# Patient Record
Sex: Male | Born: 1965 | Race: White | Hispanic: No | Marital: Married | Smoking: Never smoker
Health system: Southern US, Community
[De-identification: ages and names within clinical notes are randomized; demographics above are authoritative.]

## PROBLEM LIST (undated history)

## (undated) DIAGNOSIS — E7800 Pure hypercholesterolemia, unspecified: Secondary | ICD-10-CM

## (undated) DIAGNOSIS — T8579XA Infection and inflammatory reaction due to other internal prosthetic devices, implants and grafts, initial encounter: Secondary | ICD-10-CM

## (undated) DIAGNOSIS — I10 Essential (primary) hypertension: Secondary | ICD-10-CM

## (undated) DIAGNOSIS — T8149XA Infection following a procedure, other surgical site, initial encounter: Secondary | ICD-10-CM

## (undated) HISTORY — PX: BACK SURGERY: SHX140

## (undated) HISTORY — PX: REPLACEMENT TOTAL HIP W/  RESURFACING IMPLANTS: SUR1222

---

## 2009-07-20 ENCOUNTER — Emergency Department (HOSPITAL_BASED_OUTPATIENT_CLINIC_OR_DEPARTMENT_OTHER): Admission: EM | Admit: 2009-07-20 | Discharge: 2009-07-20 | Payer: Self-pay | Admitting: *Deleted

## 2009-10-14 ENCOUNTER — Ambulatory Visit (HOSPITAL_COMMUNITY): Admission: RE | Admit: 2009-10-14 | Discharge: 2009-10-14 | Payer: Self-pay | Admitting: *Deleted

## 2010-02-13 ENCOUNTER — Encounter: Admission: RE | Admit: 2010-02-13 | Discharge: 2010-02-13 | Payer: Self-pay | Admitting: *Deleted

## 2010-07-16 LAB — PROTIME-INR
INR: 1.02 (ref 0.00–1.49)
Prothrombin Time: 13.3 seconds (ref 11.6–15.2)

## 2010-07-16 LAB — DIFFERENTIAL
Lymphocytes Relative: 28 % (ref 12–46)
Lymphs Abs: 2.4 10*3/uL (ref 0.7–4.0)
Monocytes Relative: 10 % (ref 3–12)
Neutro Abs: 5.1 10*3/uL (ref 1.7–7.7)
Neutrophils Relative %: 60 % (ref 43–77)

## 2010-07-16 LAB — COMPREHENSIVE METABOLIC PANEL
ALT: 21 U/L (ref 0–53)
BUN: 9 mg/dL (ref 6–23)
Calcium: 9.7 mg/dL (ref 8.4–10.5)
Creatinine, Ser: 0.94 mg/dL (ref 0.4–1.5)
GFR calc non Af Amer: >60 mL/min (ref >60)
Glucose, Bld: 101 mg/dL — ABNORMAL HIGH (ref 70–99)
Sodium: 137 mEq/L (ref 135–145)
Total Protein: 7 g/dL (ref 6.0–8.3)

## 2010-07-16 LAB — URINE MICROSCOPIC-ADD ON

## 2010-07-16 LAB — CBC
Hemoglobin: 15 g/dL (ref 13.0–17.0)
MCHC: 34.8 g/dL (ref 30.0–36.0)
MCV: 86.5 fL (ref 78.0–100.0)
RDW: 12.6 % (ref 11.5–15.5)

## 2010-07-16 LAB — ABO/RH: ABO/RH(D): A POS

## 2010-07-16 LAB — APTT: aPTT: 28 seconds (ref 24–37)

## 2010-07-16 LAB — TYPE AND SCREEN: ABO/RH(D): A POS

## 2010-07-16 LAB — URINALYSIS, ROUTINE W REFLEX MICROSCOPIC
Nitrite: NEGATIVE
Protein, ur: NEGATIVE mg/dL
Specific Gravity, Urine: 1.016 (ref 1.005–1.030)
Urobilinogen, UA: 1 mg/dL (ref 0.0–1.0)

## 2013-04-30 HISTORY — PX: INGUINAL HERNIA REPAIR: SUR1180

## 2014-04-30 HISTORY — PX: HERNIA MESH REMOVAL: SHX1745

## 2014-04-30 HISTORY — PX: INGUINAL HERNIA REPAIR: SUR1180

## 2014-06-15 DIAGNOSIS — Z8249 Family history of ischemic heart disease and other diseases of the circulatory system: Secondary | ICD-10-CM | POA: Insufficient documentation

## 2014-08-24 DIAGNOSIS — N200 Calculus of kidney: Secondary | ICD-10-CM | POA: Insufficient documentation

## 2014-10-23 DIAGNOSIS — T8149XA Infection following a procedure, other surgical site, initial encounter: Secondary | ICD-10-CM

## 2014-10-23 HISTORY — DX: Infection following a procedure, other surgical site, initial encounter: T81.49XA

## 2017-03-01 ENCOUNTER — Encounter (HOSPITAL_BASED_OUTPATIENT_CLINIC_OR_DEPARTMENT_OTHER): Payer: Self-pay | Admitting: *Deleted

## 2017-03-01 ENCOUNTER — Inpatient Hospital Stay (HOSPITAL_BASED_OUTPATIENT_CLINIC_OR_DEPARTMENT_OTHER)
Admission: EM | Admit: 2017-03-01 | Discharge: 2017-03-05 | DRG: 351 | Disposition: A | Discharge status: Home / Self Care | Payer: Worker's Compensation | Attending: General Surgery | Admitting: General Surgery

## 2017-03-01 DIAGNOSIS — E7800 Pure hypercholesterolemia, unspecified: Secondary | ICD-10-CM | POA: Diagnosis present

## 2017-03-01 DIAGNOSIS — N5089 Other specified disorders of the male genital organs: Secondary | ICD-10-CM | POA: Diagnosis present

## 2017-03-01 DIAGNOSIS — D176 Benign lipomatous neoplasm of spermatic cord: Secondary | ICD-10-CM | POA: Diagnosis present

## 2017-03-01 DIAGNOSIS — Z881 Allergy status to other antibiotic agents status: Secondary | ICD-10-CM

## 2017-03-01 DIAGNOSIS — K4091 Unilateral inguinal hernia, without obstruction or gangrene, recurrent: Secondary | ICD-10-CM | POA: Diagnosis not present

## 2017-03-01 DIAGNOSIS — K4031 Unilateral inguinal hernia, with obstruction, without gangrene, recurrent: Principal | ICD-10-CM | POA: Diagnosis present

## 2017-03-01 DIAGNOSIS — K4090 Unilateral inguinal hernia, without obstruction or gangrene, not specified as recurrent: Secondary | ICD-10-CM

## 2017-03-01 DIAGNOSIS — K420 Umbilical hernia with obstruction, without gangrene: Secondary | ICD-10-CM

## 2017-03-01 DIAGNOSIS — Z22322 Carrier or suspected carrier of Methicillin resistant Staphylococcus aureus: Secondary | ICD-10-CM

## 2017-03-01 DIAGNOSIS — Z885 Allergy status to narcotic agent status: Secondary | ICD-10-CM

## 2017-03-01 DIAGNOSIS — E785 Hyperlipidemia, unspecified: Secondary | ICD-10-CM | POA: Diagnosis present

## 2017-03-01 DIAGNOSIS — I10 Essential (primary) hypertension: Secondary | ICD-10-CM | POA: Diagnosis present

## 2017-03-01 DIAGNOSIS — K4030 Unilateral inguinal hernia, with obstruction, without gangrene, not specified as recurrent: Secondary | ICD-10-CM | POA: Diagnosis present

## 2017-03-01 HISTORY — DX: Pure hypercholesterolemia, unspecified: E78.00

## 2017-03-01 HISTORY — DX: Essential (primary) hypertension: I10

## 2017-03-01 LAB — CBC WITH DIFFERENTIAL/PLATELET
BASOS ABS: 0 10*3/uL (ref 0.0–0.1)
BASOS PCT: 0 %
EOS ABS: 0.2 10*3/uL (ref 0.0–0.7)
EOS PCT: 2 %
HCT: 44.3 % (ref 39.0–52.0)
Hemoglobin: 15 g/dL (ref 13.0–17.0)
LYMPHS PCT: 23 %
Lymphs Abs: 1.9 10*3/uL (ref 0.7–4.0)
MCH: 29.9 pg (ref 26.0–34.0)
MCHC: 33.9 g/dL (ref 30.0–36.0)
MCV: 88.4 fL (ref 78.0–100.0)
MONO ABS: 0.9 10*3/uL (ref 0.1–1.0)
Monocytes Relative: 10 %
Neutro Abs: 5.3 10*3/uL (ref 1.7–7.7)
Neutrophils Relative %: 65 %
PLATELETS: 255 10*3/uL (ref 150–400)
RBC: 5.01 MIL/uL (ref 4.22–5.81)
RDW: 13 % (ref 11.5–15.5)
WBC: 8.3 10*3/uL (ref 4.0–10.5)

## 2017-03-01 LAB — BASIC METABOLIC PANEL
ANION GAP: 8 (ref 5–15)
BUN: 16 mg/dL (ref 6–20)
CALCIUM: 9.5 mg/dL (ref 8.9–10.3)
CO2: 24 mmol/L (ref 22–32)
Chloride: 106 mmol/L (ref 101–111)
Creatinine, Ser: 0.98 mg/dL (ref 0.61–1.24)
Glucose, Bld: 110 mg/dL — ABNORMAL HIGH (ref 65–99)
POTASSIUM: 4 mmol/L (ref 3.5–5.1)
SODIUM: 138 mmol/L (ref 135–145)

## 2017-03-01 MED ORDER — HYDROMORPHONE HCL 1 MG/ML IJ SOLN
0.5000 mg | Freq: Once | INTRAMUSCULAR | Status: AC
  Administered 2017-03-01: 0.5 mg via INTRAVENOUS

## 2017-03-01 MED ORDER — DIAZEPAM 5 MG/ML IJ SOLN
2.0000 mg | Freq: Once | INTRAMUSCULAR | Status: AC
  Administered 2017-03-01: 2 mg via INTRAVENOUS
  Filled 2017-03-01: qty 2

## 2017-03-01 MED ORDER — HYDROMORPHONE HCL 1 MG/ML IJ SOLN
0.5000 mg | Freq: Once | INTRAMUSCULAR | Status: AC
  Administered 2017-03-01: 0.5 mg via INTRAVENOUS
  Filled 2017-03-01: qty 1

## 2017-03-01 MED ORDER — KCL IN DEXTROSE-NACL 20-5-0.9 MEQ/L-%-% IV SOLN
INTRAVENOUS | Status: DC
  Administered 2017-03-01: 30 mL/h via INTRAVENOUS
  Administered 2017-03-03: via INTRAVENOUS
  Filled 2017-03-01 (×3): qty 1000

## 2017-03-01 MED ORDER — METHOCARBAMOL 1000 MG/10ML IJ SOLN
500.0000 mg | Freq: Four times a day (QID) | INTRAVENOUS | Status: DC
  Administered 2017-03-01 – 2017-03-05 (×13): 500 mg via INTRAVENOUS
  Filled 2017-03-01 (×8): qty 550
  Filled 2017-03-01: qty 5
  Filled 2017-03-01 (×4): qty 550
  Filled 2017-03-01: qty 5
  Filled 2017-03-01: qty 550

## 2017-03-01 MED ORDER — ONDANSETRON 4 MG PO TBDP: 4.0000 mg | ORAL_TABLET | Freq: Four times a day (QID) | ORAL | Status: DC | PRN

## 2017-03-01 MED ORDER — HYDROMORPHONE HCL 1 MG/ML IJ SOLN
1.0000 mg | Freq: Once | INTRAMUSCULAR | Status: DC
  Filled 2017-03-01: qty 1

## 2017-03-01 MED ORDER — ONDANSETRON HCL 4 MG/2ML IJ SOLN: 4.0000 mg | Freq: Four times a day (QID) | INTRAMUSCULAR | Status: DC | PRN

## 2017-03-01 MED ORDER — SODIUM CHLORIDE 0.9 % IV BOLUS (SEPSIS)
500.0000 mL | Freq: Once | INTRAVENOUS | Status: AC
  Administered 2017-03-01: 500 mL via INTRAVENOUS

## 2017-03-01 MED ORDER — MORPHINE SULFATE (PF) 2 MG/ML IV SOLN
1.0000 mg | INTRAVENOUS | Status: DC | PRN
  Administered 2017-03-02 (×3): 2 mg via INTRAVENOUS
  Administered 2017-03-02: 1 mg via INTRAVENOUS
  Administered 2017-03-03 – 2017-03-04 (×4): 2 mg via INTRAVENOUS
  Filled 2017-03-01 (×8): qty 1

## 2017-03-01 MED ORDER — ENOXAPARIN SODIUM 40 MG/0.4ML ~~LOC~~ SOLN
40.0000 mg | SUBCUTANEOUS | Status: DC
  Administered 2017-03-01: 40 mg via SUBCUTANEOUS
  Filled 2017-03-01 (×2): qty 0.4

## 2017-03-01 NOTE — H&P (Signed)
Logan Chang is an 51 y.o. male.   Chief Complaint:  Large, recurrent right inguinal hernia HPI:   This is a 51 year old male transferred from [place] for evaluation of a large, recurrent right inguinal hernia extending down into the scrotum. It occurred at work today when he was doing some lifting. He presented to the [facility]. With pain medication and muscle relaxation, Dr. Alvino Chapel was able to reduce the hernia but then it came back out. This been no nausea or vomiting. He has been able to void. He also noted an umbilical bulge today.  His past medical history with regards to this hernia is remarkable. In 2015, he underwent a primary right inguinal hernia repair by Dr. Rosario Adie in [facility]. He had a recurrent hernia in 8 months. He subsequently underwent a repair of recurrent hernia with mesh by Dr. Venia Minks. He developed a mesh infection. His wife reported that was a staph infection but not MRSA. He had to have the mesh explanted and the hernia repaired primarily. He was in the hospital on antibiotics for a week before the explant. He subsequent developed a wound infection of that operation and the wound was opened up and healed by secondary intention including using a VAC and dressing changes. Following that, he was seen by Dr. Venia Minks for the final time 11/29/2014. Dr. Venia Minks advised him not to lift anything over 20-30 pounds for the rest of his life. However, his job requires heavy lifting and so he went back to work and does repetitive lifting at work which is where this problem occurred this morning. His wife is here with him.  Past Medical History:  Diagnosis Date  . High cholesterol   . Hypertension     Past Surgical History:  Procedure Laterality Date  . BACK SURGERY    . HERNIA REPAIR    . REPLACEMENT TOTAL HIP W/  RESURFACING IMPLANTS      No family history on file. Social History:  reports that he has never smoked. He has never used  smokeless tobacco. He reports that he drinks alcohol. He reports that he does not use drugs.  Allergies:  Allergies  Allergen Reactions  . Codeine     swelling  . Sulfa Antibiotics     unknown    Prior to Admission medications   Not on File     Results for orders placed or performed during the hospital encounter of 03/01/17 (from the past 48 hour(s))  CBC with Differential     Status: None   Collection Time: 03/01/17 11:48 AM  Result Value Ref Range   WBC 8.3 4.0 - 10.5 K/uL   RBC 5.01 4.22 - 5.81 MIL/uL   Hemoglobin 15.0 13.0 - 17.0 g/dL   HCT 44.3 39.0 - 52.0 %   MCV 88.4 78.0 - 100.0 fL   MCH 29.9 26.0 - 34.0 pg   MCHC 33.9 30.0 - 36.0 g/dL   RDW 13.0 11.5 - 15.5 %   Platelets 255 150 - 400 K/uL   Neutrophils Relative % 65 %   Neutro Abs 5.3 1.7 - 7.7 K/uL   Lymphocytes Relative 23 %   Lymphs Abs 1.9 0.7 - 4.0 K/uL   Monocytes Relative 10 %   Monocytes Absolute 0.9 0.1 - 1.0 K/uL   Eosinophils Relative 2 %   Eosinophils Absolute 0.2 0.0 - 0.7 K/uL   Basophils Relative 0 %   Basophils Absolute 0.0 0.0 - 0.1 K/uL  Basic metabolic panel     Status: Abnormal   Collection Time: 03/01/17 11:48 AM  Result Value Ref Range   Sodium 138 135 - 145 mmol/L   Potassium 4.0 3.5 - 5.1 mmol/L   Chloride 106 101 - 111 mmol/L   CO2 24 22 - 32 mmol/L   Glucose, Bld 110 (H) 65 - 99 mg/dL   BUN 16 6 - 20 mg/dL   Creatinine, Ser 0.98 0.61 - 1.24 mg/dL   Calcium 9.5 8.9 - 10.3 mg/dL   GFR calc non Af Amer >60 >60 mL/min   GFR calc Af Amer >60 >60 mL/min    Comment: (NOTE) The eGFR has been calculated using the CKD EPI equation. This calculation has not been validated in all clinical situations. eGFR's persistently <60 mL/min signify possible Chronic Kidney Disease.    Anion gap 8 5 - 15   No results found.  Review of Systems  Constitutional: Positive for weight loss.  HENT: Positive for congestion and sore throat.   Respiratory: Negative for cough and shortness of breath.    Cardiovascular: Negative for chest pain.  Gastrointestinal: Positive for abdominal pain. Negative for nausea and vomiting.  Genitourinary:       He denies difficulty with urination chronically. He has been able to void.  Neurological: Positive for weakness. Negative for seizures.  Endo/Heme/Allergies:       He denies blood clots or bleeding disorders.    Blood pressure (!) 143/93, pulse 74, temperature 98.4 F (36.9 C), temperature source Oral, resp. rate 14, height 5' 8"  (1.727 m), weight 88.5 kg (195 lb), SpO2 98 %. Physical Exam  GENERAL APPEARANCE:  WDWN in NAD.  Pleasant and cooperative.  EARS, NOSE, MOUTH THROAT:  Frederic/AT external ears:  no lesions or deformities external nose:  no lesions or deformities hearing:  grossly normal lips:  moist, no deformities EYES external: conjunctiva, lids, sclerae normal pupils:  equal, round glasses: no  CV ascultation:  RRR, no murmur extremity edema:  no extremity varicosities:  no   RESP auscultation:  breath sounds equal and clear respiratory effort:  normal   GASTROINTESTINAL abdomen:  Soft, non-tender, non-distended, no masses liver and spleen:  not enlarged. hernia:  Reducible umbilical hernia; large right scrotal hernia that I was able to reduce and keep reduced using Trendelenburg position, ice, IV valim, and IV Dilaudid. scar:  Right groin  GENITOURINARY scrotum:  See above penis:  no lesions  SKIN No jaundice rash: none   NEUROLOGIC speech:  normal  PSYCHIATRIC alertness and orientation:  normal mood/affect/behavior:  normal judgement and insight:  normal   Assessment/Plan 1. Large, recurrent right inguinal hernia. He has had a complex course with previous right inguinal hernia repairs. Hernias now reduced, approximately 6 hours after he came out. No current signs of intestinal compromise.  2. Reducible umbilical hernia.  Plan: Admit for observation. Check CBC and lactate level in the morning. If it  looks like he is becoming septic, one need to go to the OR. Ideally, he would undergo an elective laparoscopic repair of this recurrent right inguinal hernia with mesh sometime next week.  I have explained the procedure, risks, and aftercare of laparoscopic inguinal hernia repair.  Risks include but are not limited to bleeding, infection, wound problems, anesthesia, recurrence, bladder or intestine injury, urinary retention, testicular dysfunction, chronic pain, mesh problems. In his case, he has an increased risk of testicular dysfunction or atrophy and possible chronic pain. He and his wife seem to understand  all this and they agree with the plan.  Odis Hollingshead, MD 03/01/2017, 4:45 PM

## 2017-03-01 NOTE — ED Notes (Signed)
Surgeon at bedside.  

## 2017-03-01 NOTE — ED Notes (Signed)
ED Provider at bedside to discuss admission to [facility]

## 2017-03-01 NOTE — ED Triage Notes (Signed)
States he was picking up a heavy object at work and felt a pain in his right lower quadrant. Thinks he has developed a hernia. Workman's comp. ? UDS requirement. His supervisor will contact us for confirmation.

## 2017-03-01 NOTE — ED Provider Notes (Addendum)
[place] EMERGENCY DEPARTMENT Provider Note   CSN: [id number] Arrival date & time: 03/01/17  1121     History   Chief Complaint Chief Complaint  Patient presents with  . Abdominal Pain    HPI Logan Chang is a 51 y.o. male.  HPI Patient presents with right-sided lower abdominal pain and likely hernia.  States he has had hernias in the past and required 3 surgeries for it after an infection.  States that he was lifting something at work today and he felt a tearing in his groin and now his scrotum was swollen and the pain is severe.  No nausea or vomiting.  States this feels like a previous hernia. Past Medical History:  Diagnosis Date  . High cholesterol   . Hypertension     There are no active problems to display for this patient.   Past Surgical History:  Procedure Laterality Date  . BACK SURGERY    . HERNIA REPAIR    . REPLACEMENT TOTAL HIP W/  RESURFACING IMPLANTS         Home Medications    Prior to Admission medications   Not on File    Family History No family history on file.  Social History Social History  Substance Use Topics  . Smoking status: Never Smoker  . Smokeless tobacco: Never Used  . Alcohol use Yes     Allergies   Codeine and Sulfa antibiotics   Review of Systems Review of Systems  Constitutional: Negative for appetite change.  Respiratory: Negative for shortness of breath.   Gastrointestinal: Positive for abdominal pain. Negative for blood in stool.  Genitourinary: Positive for scrotal swelling and testicular pain.  Musculoskeletal: Negative for back pain.  Neurological: Negative for seizures.  Hematological: Negative for adenopathy.  Psychiatric/Behavioral: Negative for confusion.     Physical Exam Updated Vital Signs BP (!) 158/95 (BP Location: Right Arm)   Pulse 80   Temp 98.3 F (36.8 C) (Oral)   Resp 16   Ht 5\' 8"  (1.727 m)   Wt 88.5 kg (195 lb)   SpO2 99%   BMI 29.65 kg/m   Physical Exam    Constitutional: He appears well-developed.  HENT:  Head: Atraumatic.  Eyes: Pupils are equal, round, and reactive to light.  Neck: Neck supple.  Cardiovascular: Normal rate.   Pulmonary/Chest: He exhibits no tenderness.  Abdominal: There is tenderness.  Tender in right lower quadrant.  Tender in the right groin with large amount of swelling going down into the right hemiscrotum.  No skin changes.  Genitourinary:  Genitourinary Comments: Swelling of scrotum, particularly on right side  Musculoskeletal: Normal range of motion.  Neurological: He is alert.  Skin: Skin is warm. Capillary refill takes less than 2 seconds.     ED Treatments / Results  Labs (all labs ordered are listed, but only abnormal results are displayed) Labs Reviewed  BASIC METABOLIC PANEL - Abnormal; Notable for the following:       Result Value   Glucose, Bld 110 (*)    All other components within normal limits  CBC WITH DIFFERENTIAL/PLATELET    EKG  EKG Interpretation None       Radiology No results found.  Procedures Procedures (including critical care time)  Medications Ordered in ED Medications  sodium chloride 0.9 % bolus 500 mL (0 mLs Intravenous Stopped 03/01/17 1252)  HYDROmorphone (DILAUDID) injection 0.5 mg (0.5 mg Intravenous Given 03/01/17 1151)  HYDROmorphone (DILAUDID) injection 0.5 mg (0.5 mg Intravenous  Given 03/01/17 1245)  diazepam (VALIUM) injection 2 mg (2 mg Intravenous Given 03/01/17 1410)     Initial Impression / Assessment and Plan / ED Course  I have reviewed the triage vital signs and the nursing notes.  Pertinent labs & imaging results that were available during my care of the patient were reviewed by me and considered in my medical decision making (see chart for details).     Patient presents with acute abdominal pain and scrotal swelling.  Has had previous inguinal hernia that was repaired by Dr. Venia Minks from [facility].  Initial repaired without mesh then had  recurrence with mesh repair.  This mesh however became infected knee required removal of mesh infection and drainage and wound care along with long-term antibiotics.  This was around 2 years ago.  Today had recurrence of the hernia.  It is a large right-sided hernia that does go down into the scrotum.  It is moderately tender.  Tenderness is going to the abdomen.  Will reduce with pain medicines Trendelenburg and ice but immediately returns and continues to be painful.  Discussed with Dr. Felipa Eth who is covering for Dr. Venia Minks.  Thinks this needs to go to a general surgeon since Dr. Venia Minks is not there today.  Patient also states Dr. Venia Minks is said he would not operate on him anymore.  Discussed with the patient today requested [facility] system.  Will discuss with general surgeon about transfer.  1:34 PM [facility] is called and said Dr. Zella Richer is in surgery and will call back when he is able.  Discussed with Janett Billow and Dr. Zella Richer.  With the recurrent hernia and the pain will transfer to [facility] so he can be seen by the surgeons.  Surgery potential is still would be delayed due to the complexity of the case.  Discussed with patient and will be transferred.   Final Clinical Impressions(s) / ED Diagnoses   Final diagnoses:  Unilateral recurrent inguinal hernia without obstruction or gangrene    New Prescriptions New Prescriptions   No medications on file     Davonna Belling, MD 03/01/17 1312    Davonna Belling, MD 03/01/17 1335    Davonna Belling, MD 03/01/17 1410

## 2017-03-01 NOTE — ED Provider Notes (Signed)
Patient received in transfer from [facility]. Afebrile, hemodynamically stable. Chart reviewed from [facility] ED visit. Right groin with large amount of swelling. Appears uncomfortable. Another dose of pain medication given. Discussed case with General Surgery PA Jackson Latino who is aware of patient's arrival. GenSurg to evaluate patient at the bedside.   General surgery, Dr. Zella Richer, has evaluated patient and will admit.    Myrna Vonseggern, Ozella Almond, PA-C 03/01/17 Einar Crow    Duffy Bruce, MD 03/03/17 [phone]

## 2017-03-02 DIAGNOSIS — E785 Hyperlipidemia, unspecified: Secondary | ICD-10-CM | POA: Diagnosis present

## 2017-03-02 DIAGNOSIS — I10 Essential (primary) hypertension: Secondary | ICD-10-CM | POA: Diagnosis present

## 2017-03-02 DIAGNOSIS — D176 Benign lipomatous neoplasm of spermatic cord: Secondary | ICD-10-CM | POA: Diagnosis present

## 2017-03-02 DIAGNOSIS — K4031 Unilateral inguinal hernia, with obstruction, without gangrene, recurrent: Secondary | ICD-10-CM | POA: Diagnosis present

## 2017-03-02 DIAGNOSIS — N5089 Other specified disorders of the male genital organs: Secondary | ICD-10-CM | POA: Diagnosis present

## 2017-03-02 DIAGNOSIS — Z881 Allergy status to other antibiotic agents status: Secondary | ICD-10-CM | POA: Diagnosis not present

## 2017-03-02 DIAGNOSIS — E7800 Pure hypercholesterolemia, unspecified: Secondary | ICD-10-CM | POA: Diagnosis present

## 2017-03-02 DIAGNOSIS — Z22322 Carrier or suspected carrier of Methicillin resistant Staphylococcus aureus: Secondary | ICD-10-CM | POA: Diagnosis not present

## 2017-03-02 DIAGNOSIS — K4091 Unilateral inguinal hernia, without obstruction or gangrene, recurrent: Secondary | ICD-10-CM | POA: Diagnosis present

## 2017-03-02 DIAGNOSIS — Z885 Allergy status to narcotic agent status: Secondary | ICD-10-CM | POA: Diagnosis not present

## 2017-03-02 DIAGNOSIS — K420 Umbilical hernia with obstruction, without gangrene: Secondary | ICD-10-CM | POA: Diagnosis present

## 2017-03-02 LAB — CBC
HEMATOCRIT: 45.6 % (ref 39.0–52.0)
HEMOGLOBIN: 14.9 g/dL (ref 13.0–17.0)
MCH: 29.6 pg (ref 26.0–34.0)
MCHC: 32.7 g/dL (ref 30.0–36.0)
MCV: 90.5 fL (ref 78.0–100.0)
Platelets: 236 10*3/uL (ref 150–400)
RBC: 5.04 MIL/uL (ref 4.22–5.81)
RDW: 12.6 % (ref 11.5–15.5)
WBC: 7.9 10*3/uL (ref 4.0–10.5)

## 2017-03-02 LAB — LACTIC ACID, PLASMA: Lactic Acid, Venous: 1.2 mmol/L (ref 0.5–1.9)

## 2017-03-02 MED ORDER — ENSURE ENLIVE PO LIQD
237.0000 mL | Freq: Two times a day (BID) | ORAL | Status: DC
  Administered 2017-03-02 – 2017-03-03 (×2): 237 mL via ORAL

## 2017-03-02 MED ORDER — IBUPROFEN 800 MG PO TABS
800.0000 mg | ORAL_TABLET | Freq: Three times a day (TID) | ORAL | Status: DC
  Administered 2017-03-02 – 2017-03-05 (×7): 800 mg via ORAL
  Filled 2017-03-02 (×7): qty 1

## 2017-03-02 NOTE — Progress Notes (Signed)
Assessment Active Problems:   Recurrent right inguinal hernia-acute tear leading to pain and acute recurrent hernia; pain control a little better; hernia is not incarcerated but does come out and is reducible; still with some scrotal edema but a little better; history of complications with previous repairs   Plan:  Continue ice, Robaxin, add Ibuprofen to help with edema; OOB to chair; Laparoscopic hernia repair with mesh on Monday; I do not think he can manage this well at home and I feel we optimize everything and decrease the edema and pain so as to decrease his risk of complications by him staying in the hospital.   LOS: 0 days        Chief Complaint/Subjective: Feels a little better.  Hernia out a little this AM but not like before.  Requiring some pain medication.  No nausea.  Passing gas.   Objective: Vital signs in last 24 hours: Temp:  [97.6 F (36.4 C)-98.4 F (36.9 C)] 97.6 F (36.4 C) (11/03 0600) Pulse Rate:  [63-90] 64 (11/03 0600) Resp:  [14-18] 18 (11/03 0600) BP: (131-158)/(74-95) 132/74 (11/03 0600) SpO2:  [94 %-99 %] 97 % (11/03 0600) Weight:  [88.5 kg (195 lb)] 88.5 kg (195 lb) (11/02 1127) Last BM Date: 03/01/17  Intake/Output from previous day: 11/02 0701 - 11/03 0700 In: 210.5 [I.V.:210.5] Out: 500 [Urine:500] Intake/Output this shift: No intake/output data recorded.  PE: General- In NAD.  Awake and alert. Abdomen-soft, not tender or distended, right groin scar, right inguinal hernia extending down into scrotum but less so than yesterday and is reducible; decrease in scrotal edema, no erythema  Lab Results:   Recent Labs  03/01/17 1148 03/02/17 0507  WBC 8.3 7.9  HGB 15.0 14.9  HCT 44.3 45.6  PLT 255 236   BMET  Recent Labs  03/01/17 1148  NA 138  K 4.0  CL 106  CO2 24  GLUCOSE 110*  BUN 16  CREATININE 0.98  CALCIUM 9.5   PT/INR No results for input(s): LABPROT, INR in the last 72 hours. Comprehensive Metabolic Panel:     Component Value Date/Time   NA 138 03/01/2017 1148   NA 137 10/13/2009 1559   K 4.0 03/01/2017 1148   K 4.3 10/13/2009 1559   CL 106 03/01/2017 1148   CL 102 10/13/2009 1559   CO2 24 03/01/2017 1148   CO2 30 10/13/2009 1559   BUN 16 03/01/2017 1148   BUN 9 10/13/2009 1559   CREATININE 0.98 03/01/2017 1148   CREATININE 0.94 10/13/2009 1559   GLUCOSE 110 (H) 03/01/2017 1148   GLUCOSE 101 (H) 10/13/2009 1559   CALCIUM 9.5 03/01/2017 1148   CALCIUM 9.7 10/13/2009 1559   AST 17 10/13/2009 1559   ALT 21 10/13/2009 1559   ALKPHOS 101 10/13/2009 1559   BILITOT 0.7 10/13/2009 1559   PROT 7.0 10/13/2009 1559   ALBUMIN 4.3 10/13/2009 1559     Studies/Results: No results found.  Anti-infectives: Anti-infectives    None       Layliana Devins J 03/02/2017

## 2017-03-03 ENCOUNTER — Other Ambulatory Visit: Payer: Self-pay

## 2017-03-03 LAB — HIV ANTIBODY (ROUTINE TESTING W REFLEX): HIV SCREEN 4TH GENERATION: NONREACTIVE

## 2017-03-03 LAB — SURGICAL PCR SCREEN
MRSA, PCR: POSITIVE — AB
Staphylococcus aureus: POSITIVE — AB

## 2017-03-03 MED ORDER — MUPIROCIN 2 % EX OINT
1.0000 | TOPICAL_OINTMENT | Freq: Two times a day (BID) | CUTANEOUS | Status: DC
  Administered 2017-03-03 – 2017-03-05 (×4): 1 via NASAL
  Filled 2017-03-03: qty 22

## 2017-03-03 MED ORDER — CHLORHEXIDINE GLUCONATE CLOTH 2 % EX PADS
6.0000 | MEDICATED_PAD | Freq: Every day | CUTANEOUS | Status: DC
  Administered 2017-03-04: 6 via TOPICAL

## 2017-03-03 MED ORDER — ENOXAPARIN SODIUM 40 MG/0.4ML ~~LOC~~ SOLN
40.0000 mg | SUBCUTANEOUS | Status: AC
  Administered 2017-03-03: 40 mg via SUBCUTANEOUS
  Filled 2017-03-03: qty 0.4

## 2017-03-03 NOTE — Progress Notes (Signed)
Recently received call from Lab who reported Surgical PCR was MRSA +. See new orders entered.

## 2017-03-03 NOTE — Progress Notes (Signed)
Patient ID: Logan Chang, male   DOB: [birth year], 51 y.o.   MRN: [id number]     Subjective: Hernia out but not especially painful and no GI symptoms  Objective: Vital signs in last 24 hours: Temp:  [97.6 F (36.4 C)-98.2 F (36.8 C)] 97.6 F (36.4 C) (11/04 0631) Pulse Rate:  [69-73] 73 (11/04 0631) Resp:  [18] 18 (11/03 2126) BP: (100-127)/(60-84) 100/68 (11/04 0631) SpO2:  [96 %-99 %] 96 % (11/04 0631) Last BM Date: 03/01/17  Intake/Output from previous day: 11/03 0701 - 11/04 0700 In: 2165 [P.O.:1140; I.V.:750; IV Piggyback:275] Out: 1500 [Urine:1500] Intake/Output this shift: No intake/output data recorded.  General appearance: alert, cooperative and no distress GI: normal findings: soft, non-tender and Nondistended  Right scrotal hernia with some persistent gentle pressure is completely reducible.  Lab Results:  Recent Labs    03/01/17 1148 03/02/17 0507  WBC 8.3 7.9  HGB 15.0 14.9  HCT 44.3 45.6  PLT 255 236   BMET Recent Labs    03/01/17 1148  NA 138  K 4.0  CL 106  CO2 24  GLUCOSE 110*  BUN 16  CREATININE 0.98  CALCIUM 9.5     Studies/Results: No results found.  Anti-infectives: Anti-infectives (From admission, onward)   None      Assessment/Plan: Multiply recurrent right inguinal hernia.  This will require repair.  Due to multiple previous anterior repairs and history of infection a laparoscopic approach would seem to be preferable.  Patient will be made n.p.o. for possible laparoscopic right inguinal hernia repair after discussion with Dr. gross tomorrow morning.    LOS: 1 day    Chrystel Barefield T 03/03/2017

## 2017-03-04 ENCOUNTER — Encounter (HOSPITAL_COMMUNITY): Admission: EM | Disposition: A | Discharge status: Home / Self Care | Payer: Self-pay | Source: Home / Self Care

## 2017-03-04 ENCOUNTER — Inpatient Hospital Stay (HOSPITAL_COMMUNITY): Payer: Worker's Compensation | Admitting: Anesthesiology

## 2017-03-04 ENCOUNTER — Encounter (HOSPITAL_COMMUNITY): Payer: Self-pay | Admitting: Surgery

## 2017-03-04 DIAGNOSIS — I10 Essential (primary) hypertension: Secondary | ICD-10-CM | POA: Diagnosis present

## 2017-03-04 DIAGNOSIS — K420 Umbilical hernia with obstruction, without gangrene: Secondary | ICD-10-CM

## 2017-03-04 DIAGNOSIS — K4090 Unilateral inguinal hernia, without obstruction or gangrene, not specified as recurrent: Secondary | ICD-10-CM

## 2017-03-04 DIAGNOSIS — E7800 Pure hypercholesterolemia, unspecified: Secondary | ICD-10-CM | POA: Diagnosis present

## 2017-03-04 HISTORY — PX: INSERTION OF MESH: SHX5868

## 2017-03-04 HISTORY — PX: INGUINAL HERNIA REPAIR: SHX194

## 2017-03-04 SURGERY — REPAIR, HERNIA, INGUINAL, BILATERAL, LAPAROSCOPIC
Anesthesia: General | Laterality: Bilateral

## 2017-03-04 SURGERY — REPAIR, HERNIA, INGUINAL, BILATERAL, LAPAROSCOPIC
Anesthesia: General | Site: Abdomen | Laterality: Bilateral

## 2017-03-04 MED ORDER — HYDROMORPHONE HCL 1 MG/ML IJ SOLN
0.5000 mg | INTRAMUSCULAR | Status: DC | PRN
  Administered 2017-03-04: 1 mg via INTRAVENOUS
  Administered 2017-03-04: 2 mg via INTRAVENOUS
  Filled 2017-03-04: qty 1
  Filled 2017-03-04 (×2): qty 2

## 2017-03-04 MED ORDER — BUPIVACAINE-EPINEPHRINE (PF) 0.25% -1:200000 IJ SOLN
INTRAMUSCULAR | Status: AC
  Filled 2017-03-04: qty 30

## 2017-03-04 MED ORDER — MIDAZOLAM HCL 2 MG/2ML IJ SOLN
INTRAMUSCULAR | Status: AC
  Filled 2017-03-04: qty 2

## 2017-03-04 MED ORDER — LIP MEDEX EX OINT
1.0000 "application " | TOPICAL_OINTMENT | Freq: Two times a day (BID) | CUTANEOUS | Status: DC
  Administered 2017-03-04 – 2017-03-05 (×2): 1 via TOPICAL
  Filled 2017-03-04 (×2): qty 7

## 2017-03-04 MED ORDER — ROCURONIUM BROMIDE 50 MG/5ML IV SOSY
PREFILLED_SYRINGE | INTRAVENOUS | Status: DC | PRN
  Administered 2017-03-04 (×3): 10 mg via INTRAVENOUS
  Administered 2017-03-04: 50 mg via INTRAVENOUS

## 2017-03-04 MED ORDER — PROPOFOL 10 MG/ML IV BOLUS
INTRAVENOUS | Status: DC | PRN
  Administered 2017-03-04: 200 mg via INTRAVENOUS

## 2017-03-04 MED ORDER — ONDANSETRON HCL 4 MG/2ML IJ SOLN
INTRAMUSCULAR | Status: DC | PRN
  Administered 2017-03-04: 4 mg via INTRAVENOUS

## 2017-03-04 MED ORDER — HYDRALAZINE HCL 20 MG/ML IJ SOLN
5.0000 mg | Freq: Four times a day (QID) | INTRAMUSCULAR | Status: DC | PRN
  Administered 2017-03-04: 20 mg via INTRAVENOUS
  Filled 2017-03-04: qty 1

## 2017-03-04 MED ORDER — POLYETHYLENE GLYCOL 3350 17 G PO PACK
17.0000 g | PACK | Freq: Every day | ORAL | Status: DC
  Filled 2017-03-04: qty 1

## 2017-03-04 MED ORDER — OXYCODONE HCL 5 MG PO TABS: 5.0000 mg | ORAL_TABLET | ORAL | 0 refills | Status: DC | PRN

## 2017-03-04 MED ORDER — LABETALOL HCL 5 MG/ML IV SOLN
INTRAVENOUS | Status: AC
  Filled 2017-03-04: qty 4

## 2017-03-04 MED ORDER — ASPIRIN EC 81 MG PO TBEC
81.0000 mg | DELAYED_RELEASE_TABLET | Freq: Every day | ORAL | Status: DC
  Administered 2017-03-05: 81 mg via ORAL
  Filled 2017-03-04: qty 1

## 2017-03-04 MED ORDER — 0.9 % SODIUM CHLORIDE (POUR BTL) OPTIME
TOPICAL | Status: DC | PRN
  Administered 2017-03-04: 1000 mL

## 2017-03-04 MED ORDER — CEFAZOLIN SODIUM-DEXTROSE 2-4 GM/100ML-% IV SOLN
INTRAVENOUS | Status: AC
  Filled 2017-03-04: qty 100

## 2017-03-04 MED ORDER — CELECOXIB 200 MG PO CAPS
200.0000 mg | ORAL_CAPSULE | ORAL | Status: AC
  Administered 2017-03-04: 200 mg via ORAL

## 2017-03-04 MED ORDER — LABETALOL HCL 5 MG/ML IV SOLN
INTRAVENOUS | Status: DC | PRN
  Administered 2017-03-04: 5 mg via INTRAVENOUS

## 2017-03-04 MED ORDER — PHENOL 1.4 % MT LIQD: 1.0000 | OROMUCOSAL | Status: DC | PRN

## 2017-03-04 MED ORDER — PROMETHAZINE HCL 25 MG/ML IJ SOLN
INTRAMUSCULAR | Status: AC
  Filled 2017-03-04: qty 1

## 2017-03-04 MED ORDER — LACTATED RINGERS IV SOLN
INTRAVENOUS | Status: DC
  Administered 2017-03-04: 15:00:00 via INTRAVENOUS
  Administered 2017-03-04: 1000 mL via INTRAVENOUS

## 2017-03-04 MED ORDER — OXYCODONE HCL 5 MG PO TABS
5.0000 mg | ORAL_TABLET | ORAL | Status: DC | PRN
  Administered 2017-03-05 (×2): 10 mg via ORAL
  Filled 2017-03-04 (×2): qty 2

## 2017-03-04 MED ORDER — LACTATED RINGERS IR SOLN
Status: DC | PRN
  Administered 2017-03-04: 1000 mL

## 2017-03-04 MED ORDER — POLYETHYLENE GLYCOL 3350 17 G PO PACK: 17.0000 g | PACK | Freq: Two times a day (BID) | ORAL | Status: DC | PRN

## 2017-03-04 MED ORDER — ROCURONIUM BROMIDE 50 MG/5ML IV SOSY
PREFILLED_SYRINGE | INTRAVENOUS | Status: AC
  Filled 2017-03-04: qty 5

## 2017-03-04 MED ORDER — HYDROMORPHONE HCL 1 MG/ML IJ SOLN
0.2500 mg | INTRAMUSCULAR | Status: AC | PRN
  Administered 2017-03-04 (×4): 0.5 mg via INTRAVENOUS

## 2017-03-04 MED ORDER — PROMETHAZINE HCL 25 MG/ML IJ SOLN: 6.2500 mg | INTRAMUSCULAR | Status: DC | PRN

## 2017-03-04 MED ORDER — CEFAZOLIN SODIUM-DEXTROSE 2-4 GM/100ML-% IV SOLN
2.0000 g | INTRAVENOUS | Status: AC
  Administered 2017-03-04: 2 g via INTRAVENOUS

## 2017-03-04 MED ORDER — FENTANYL CITRATE (PF) 100 MCG/2ML IJ SOLN
INTRAMUSCULAR | Status: DC | PRN
  Administered 2017-03-04 (×5): 50 ug via INTRAVENOUS

## 2017-03-04 MED ORDER — LIDOCAINE 2% (20 MG/ML) 5 ML SYRINGE
INTRAMUSCULAR | Status: DC | PRN
  Administered 2017-03-04: 50 mg via INTRAVENOUS

## 2017-03-04 MED ORDER — ACETAMINOPHEN 500 MG PO TABS
1000.0000 mg | ORAL_TABLET | Freq: Three times a day (TID) | ORAL | Status: DC
  Administered 2017-03-04 – 2017-03-05 (×3): 1000 mg via ORAL
  Filled 2017-03-04 (×3): qty 2

## 2017-03-04 MED ORDER — LOSARTAN POTASSIUM 50 MG PO TABS
50.0000 mg | ORAL_TABLET | Freq: Every day | ORAL | Status: DC
  Administered 2017-03-05: 50 mg via ORAL
  Filled 2017-03-04: qty 1

## 2017-03-04 MED ORDER — METOPROLOL TARTRATE 5 MG/5ML IV SOLN: 5.0000 mg | Freq: Four times a day (QID) | INTRAVENOUS | Status: DC | PRN

## 2017-03-04 MED ORDER — ACETAMINOPHEN 500 MG PO TABS
ORAL_TABLET | ORAL | Status: AC
  Filled 2017-03-04: qty 2

## 2017-03-04 MED ORDER — SODIUM CHLORIDE 0.9 % IV SOLN: 250.0000 mL | INTRAVENOUS | Status: DC | PRN

## 2017-03-04 MED ORDER — DEXAMETHASONE SODIUM PHOSPHATE 10 MG/ML IJ SOLN
INTRAMUSCULAR | Status: AC
  Filled 2017-03-04: qty 1

## 2017-03-04 MED ORDER — STERILE WATER FOR IRRIGATION IR SOLN
Status: DC | PRN
  Administered 2017-03-04: 1000 mL

## 2017-03-04 MED ORDER — SODIUM CHLORIDE 0.9 % IV SOLN
INTRAVENOUS | Status: DC
  Filled 2017-03-04: qty 6

## 2017-03-04 MED ORDER — SUGAMMADEX SODIUM 200 MG/2ML IV SOLN
INTRAVENOUS | Status: DC | PRN
  Administered 2017-03-04: 200 mg via INTRAVENOUS

## 2017-03-04 MED ORDER — MENTHOL 3 MG MT LOZG
1.0000 | LOZENGE | OROMUCOSAL | Status: DC | PRN
Start: 2017-03-04 — End: 2017-03-05

## 2017-03-04 MED ORDER — FENTANYL CITRATE (PF) 100 MCG/2ML IJ SOLN
25.0000 ug | INTRAMUSCULAR | Status: DC | PRN
  Administered 2017-03-04 (×3): 50 ug via INTRAVENOUS

## 2017-03-04 MED ORDER — SODIUM CHLORIDE 0.9% FLUSH
3.0000 mL | Freq: Two times a day (BID) | INTRAVENOUS | Status: DC
  Administered 2017-03-05: 3 mL via INTRAVENOUS

## 2017-03-04 MED ORDER — GABAPENTIN 300 MG PO CAPS
300.0000 mg | ORAL_CAPSULE | Freq: Every day | ORAL | Status: DC
  Administered 2017-03-04: 300 mg via ORAL
  Filled 2017-03-04: qty 1

## 2017-03-04 MED ORDER — FENTANYL CITRATE (PF) 250 MCG/5ML IJ SOLN
INTRAMUSCULAR | Status: AC
  Filled 2017-03-04: qty 5

## 2017-03-04 MED ORDER — VANCOMYCIN HCL IN DEXTROSE 1-5 GM/200ML-% IV SOLN
1000.0000 mg | INTRAVENOUS | Status: AC
  Administered 2017-03-04: 1000 mg via INTRAVENOUS

## 2017-03-04 MED ORDER — SIMVASTATIN 40 MG PO TABS
40.0000 mg | ORAL_TABLET | Freq: Every day | ORAL | Status: DC
  Administered 2017-03-05: 40 mg via ORAL
  Filled 2017-03-04: qty 2

## 2017-03-04 MED ORDER — LIDOCAINE 2% (20 MG/ML) 5 ML SYRINGE
INTRAMUSCULAR | Status: AC
  Filled 2017-03-04: qty 5

## 2017-03-04 MED ORDER — CELECOXIB 200 MG PO CAPS
ORAL_CAPSULE | ORAL | Status: AC
Start: 2017-03-04 — End: 2017-03-05
  Filled 2017-03-04: qty 1

## 2017-03-04 MED ORDER — DIPHENHYDRAMINE HCL 50 MG/ML IJ SOLN: 12.5000 mg | Freq: Four times a day (QID) | INTRAMUSCULAR | Status: DC | PRN

## 2017-03-04 MED ORDER — MIDAZOLAM HCL 5 MG/5ML IJ SOLN
INTRAMUSCULAR | Status: DC | PRN
  Administered 2017-03-04: 2 mg via INTRAVENOUS

## 2017-03-04 MED ORDER — ONDANSETRON HCL 4 MG/2ML IJ SOLN
INTRAMUSCULAR | Status: AC
  Filled 2017-03-04: qty 2

## 2017-03-04 MED ORDER — VANCOMYCIN HCL IN DEXTROSE 1-5 GM/200ML-% IV SOLN
INTRAVENOUS | Status: AC
  Filled 2017-03-04: qty 200

## 2017-03-04 MED ORDER — MAGIC MOUTHWASH
15.0000 mL | Freq: Four times a day (QID) | ORAL | Status: DC | PRN
  Filled 2017-03-04: qty 15

## 2017-03-04 MED ORDER — METHOCARBAMOL 1000 MG/10ML IJ SOLN
1000.0000 mg | Freq: Four times a day (QID) | INTRAMUSCULAR | Status: DC | PRN
  Filled 2017-03-04: qty 10

## 2017-03-04 MED ORDER — PROCHLORPERAZINE EDISYLATE 5 MG/ML IJ SOLN: 5.0000 mg | INTRAMUSCULAR | Status: DC | PRN

## 2017-03-04 MED ORDER — ACETAMINOPHEN 500 MG PO TABS
1000.0000 mg | ORAL_TABLET | ORAL | Status: AC
  Administered 2017-03-04: 1000 mg via ORAL

## 2017-03-04 MED ORDER — PROPOFOL 10 MG/ML IV BOLUS
INTRAVENOUS | Status: AC
  Filled 2017-03-04: qty 20

## 2017-03-04 MED ORDER — ALUM & MAG HYDROXIDE-SIMETH 200-200-20 MG/5ML PO SUSP: 30.0000 mL | Freq: Four times a day (QID) | ORAL | Status: DC | PRN

## 2017-03-04 MED ORDER — FENTANYL CITRATE (PF) 100 MCG/2ML IJ SOLN
INTRAMUSCULAR | Status: AC
  Filled 2017-03-04: qty 4

## 2017-03-04 MED ORDER — CHLORHEXIDINE GLUCONATE CLOTH 2 % EX PADS: 6.0000 | MEDICATED_PAD | Freq: Once | CUTANEOUS | Status: DC

## 2017-03-04 MED ORDER — LACTATED RINGERS IV BOLUS (SEPSIS): 1000.0000 mL | Freq: Three times a day (TID) | INTRAVENOUS | Status: DC | PRN

## 2017-03-04 MED ORDER — GABAPENTIN 300 MG PO CAPS
300.0000 mg | ORAL_CAPSULE | ORAL | Status: AC
  Administered 2017-03-04: 300 mg via ORAL

## 2017-03-04 MED ORDER — METHOCARBAMOL 500 MG PO TABS
1000.0000 mg | ORAL_TABLET | Freq: Four times a day (QID) | ORAL | Status: DC | PRN
  Administered 2017-03-05: 1000 mg via ORAL
  Filled 2017-03-04 (×2): qty 2

## 2017-03-04 MED ORDER — HYDROCORTISONE 2.5 % RE CREA
1.0000 | TOPICAL_CREAM | Freq: Four times a day (QID) | RECTAL | Status: DC | PRN
Start: 2017-03-04 — End: 2017-03-05

## 2017-03-04 MED ORDER — HYDROCORTISONE 1 % EX CREA: 1.0000 | TOPICAL_CREAM | Freq: Three times a day (TID) | CUTANEOUS | Status: DC | PRN

## 2017-03-04 MED ORDER — GABAPENTIN 300 MG PO CAPS
ORAL_CAPSULE | ORAL | Status: AC
  Filled 2017-03-04: qty 1

## 2017-03-04 MED ORDER — SODIUM CHLORIDE 0.9% FLUSH: 3.0000 mL | INTRAVENOUS | Status: DC | PRN

## 2017-03-04 MED ORDER — NAPROXEN 500 MG PO TABS: 500.0000 mg | ORAL_TABLET | Freq: Two times a day (BID) | ORAL | 1 refills | Status: AC | PRN

## 2017-03-04 MED ORDER — BUPIVACAINE-EPINEPHRINE 0.25% -1:200000 IJ SOLN
INTRAMUSCULAR | Status: AC
  Filled 2017-03-04: qty 1

## 2017-03-04 MED ORDER — GUAIFENESIN-DM 100-10 MG/5ML PO SYRP: 10.0000 mL | ORAL_SOLUTION | ORAL | Status: DC | PRN

## 2017-03-04 MED ORDER — BUPIVACAINE-EPINEPHRINE 0.25% -1:200000 IJ SOLN
INTRAMUSCULAR | Status: DC | PRN
Start: 2017-03-04 — End: 2017-03-04
  Administered 2017-03-04: 80 mL

## 2017-03-04 MED ORDER — HYDROMORPHONE HCL 1 MG/ML IJ SOLN
INTRAMUSCULAR | Status: AC
  Filled 2017-03-04: qty 2

## 2017-03-04 SURGICAL SUPPLY — 36 items
CABLE HIGH FREQUENCY MONO STRZ (ELECTRODE) ×4 IMPLANT
CHLORAPREP W/TINT 26ML (MISCELLANEOUS) ×4 IMPLANT
COVER SURGICAL LIGHT HANDLE (MISCELLANEOUS) ×4 IMPLANT
DECANTER SPIKE VIAL GLASS SM (MISCELLANEOUS) IMPLANT
DEVICE SECURE STRAP 25 ABSORB (INSTRUMENTS) ×4 IMPLANT
DRAPE WARM FLUID 44X44 (DRAPE) ×4 IMPLANT
DRSG TEGADERM 2-3/8X2-3/4 SM (GAUZE/BANDAGES/DRESSINGS) ×4 IMPLANT
DRSG TEGADERM 4X4.75 (GAUZE/BANDAGES/DRESSINGS) ×4 IMPLANT
ELECT REM PT RETURN 15FT ADLT (MISCELLANEOUS) ×4 IMPLANT
GAUZE SPONGE 2X2 8PLY STRL LF (GAUZE/BANDAGES/DRESSINGS) ×2 IMPLANT
GLOVE ECLIPSE 8.0 STRL XLNG CF (GLOVE) ×4 IMPLANT
GLOVE INDICATOR 8.0 STRL GRN (GLOVE) ×4 IMPLANT
GOWN STRL REUS W/TWL XL LVL3 (GOWN DISPOSABLE) ×12 IMPLANT
IRRIG SUCT STRYKERFLOW 2 WTIP (MISCELLANEOUS) ×4
IRRIGATION SUCT STRKRFLW 2 WTP (MISCELLANEOUS) ×2 IMPLANT
KIT BASIN OR (CUSTOM PROCEDURE TRAY) ×4 IMPLANT
MARKER SKIN DUAL TIP RULER LAB (MISCELLANEOUS) ×4 IMPLANT
MESH HERNIA 6X6 BARD (Mesh General) ×6 IMPLANT
MESH HERNIA BARD 6X6 (Mesh General) ×6 IMPLANT
NEEDLE INSUFFLATION 14GA 120MM (NEEDLE) IMPLANT
PAD POSITIONING PINK XL (MISCELLANEOUS) ×4 IMPLANT
SCISSORS LAP 5X35 DISP (ENDOMECHANICALS) ×4 IMPLANT
SLEEVE ADV FIXATION 5X100MM (TROCAR) ×4 IMPLANT
SPONGE GAUZE 2X2 STER 10/PKG (GAUZE/BANDAGES/DRESSINGS) ×2
SUT MNCRL AB 4-0 PS2 18 (SUTURE) ×4 IMPLANT
SUT PDS AB 1 CT1 27 (SUTURE) ×12 IMPLANT
SUT VIC AB 2-0 SH 27 (SUTURE) ×2
SUT VIC AB 2-0 SH 27X BRD (SUTURE) ×2 IMPLANT
SUT VICRYL 0 UR6 27IN ABS (SUTURE) ×4 IMPLANT
TACKER 5MM HERNIA 3.5CML NAB (ENDOMECHANICALS) IMPLANT
TOWEL OR 17X26 10 PK STRL BLUE (TOWEL DISPOSABLE) ×4 IMPLANT
TOWEL OR NON WOVEN STRL DISP B (DISPOSABLE) ×4 IMPLANT
TRAY LAPAROSCOPIC (CUSTOM PROCEDURE TRAY) ×4 IMPLANT
TROCAR ADV FIXATION 5X100MM (TROCAR) ×4 IMPLANT
TROCAR XCEL BLUNT TIP 100MML (ENDOMECHANICALS) ×4 IMPLANT
TUBING INSUF HEATED (TUBING) ×4 IMPLANT

## 2017-03-04 NOTE — Anesthesia Postprocedure Evaluation (Signed)
Anesthesia Post Note  Patient: Logan Chang  Procedure(s) Performed: LAPAROSCOPIC BILATERAL INGUINAL HERNIA REPAIR WITH MESH (Bilateral Abdomen) INSERTION OF MESH (Abdomen)     Patient location during evaluation: PACU Anesthesia Type: General Level of consciousness: awake and alert Pain management: pain level controlled Vital Signs Assessment: post-procedure vital signs reviewed and stable Respiratory status: spontaneous breathing, nonlabored ventilation, respiratory function stable and patient connected to nasal cannula oxygen Cardiovascular status: blood pressure returned to baseline and stable Postop Assessment: no apparent nausea or vomiting Anesthetic complications: no    Last Vitals:  Vitals:   03/04/17 1645 03/04/17 1700  BP: (!) 168/99 (!) 164/93  Pulse: 80 80  Resp: 12 14  Temp: (!) 36.3 C 36.5 C  SpO2: 98% 100%    Last Pain:  Vitals:   03/04/17 1645  TempSrc:   PainSc: 4                  Catalina Gravel

## 2017-03-04 NOTE — Op Note (Signed)
03/04/2017  3:44 PM  PATIENT:  Logan Chang  51 y.o. adult  Patient Care Team: Manfred Shirts, PA as PCP - General (General Practice) Michael Boston, MD as Consulting Physician (General Surgery)  PRE-OPERATIVE DIAGNOSIS:    RECURRENT INCARCERATED RIGHT INGUINAL HERNIA,  LEFT INGUINAL HERNIA  INCARCERATED UMBILICAL HERNIA  POST-OPERATIVE DIAGNOSIS:    RECURRENT INCARCERATED RIGHT INGUINAL HERNIA,  LEFT INGUINAL HERNIA  INCARCERATED UMBILICAL HERNIA  PROCEDURE:   LAPAROSCOPIC BILATERAL INGUINAL HERNIA REPAIR WITH MESH PRIMARY UMBILICAL HERNIA REPAIR INSERTION OF MESH  SURGEON:  [name], MD  ASSISTANT: None  ANESTHESIA:     Regional ilioinguinal and genitofemoral and spermatic cord nerve blocks  General  EBL:  Total I/O In: 1233.5 [I.V.:1233.5] Out: 2706 [CBJSE:8315].  See anesthesia record  Delay start of Pharmacological VTE agent (>24hrs) due to surgical blood loss or risk of bleeding:  no  DRAINS: NONE  SPECIMEN:  NONE  DISPOSITION OF SPECIMEN:  N/A  COUNTS:  YES  PLAN OF CARE: Discharge to home after PACU  PATIENT DISPOSITION:  PACU - hemodynamically stable.  INDICATION: 51 year old male with history of incarcerated right inguinal hernia.  Required emergent primary repair.  Had early recurrence.  Had repair with mesh.  Develop mesh infection requiring explant and prolonged wound care.  This happened at [facility] hospital 2015-2016.  Has job with heavy lifting requirements and cannot find alternative.  Felt pain and popping in right groin and bellybutton.  Came in with recurrent incarcerated hernia.  Reduced.  Placed on antibiotics.  Improved.  Wish to consider hernia repair.  I discussed options including possible biologic mesh. Increased risks of chronic nerve pain, testicular loss, hernia recurrence, and mesh infection were discussed.  The anatomy & physiology of the abdominal wall and pelvic floor was discussed.  The pathophysiology of  hernias in the inguinal and pelvic region was discussed.  Natural history risks such as progressive enlargement, pain, incarceration & strangulation was discussed.   Contributors to complications such as smoking, obesity, diabetes, prior surgery, etc were discussed.    I feel the risks of no intervention will lead to serious problems that outweigh the operative risks; therefore, I recommended surgery to reduce and repair the hernia.  I explained laparoscopic techniques with possible need for an open approach.  I noted usual use of mesh to patch and/or buttress hernia repair  Risks such as bleeding, infection, abscess, need for further treatment, heart attack, death, and other risks were discussed.  I noted a good likelihood this will help address the problem.   Goals of post-operative recovery were discussed as well.  Possibility that this will not correct all symptoms was explained.  I stressed the importance of low-impact activity, aggressive pain control, avoiding constipation, & not pushing through pain to minimize risk of post-operative chronic pain or injury. Possibility of reherniation was discussed.  We will work to minimize complications.     An educational handout further explaining the pathology & treatment options was given as well.  Questions were answered.  The patient expresses understanding & wishes to proceed with surgery.  OR FINDINGS: Large indirect recurrent renal hernia with ileum and some of cecum within it.  Eventually reduced.  No evidence of any ischemia or necrosis.  Smaller left-sided indirect inguinal hernia.  No obvious femoral or obturator hernias.  Umbilical hernia incarcerated with omentum.  Able to be reduced.  1 cm.  Primarily repaired.  DESCRIPTION:  The patient was identified & brought into the  operating room. The patient was positioned supine with arms tucked. SCDs were active during the entire case. The patient underwent general anesthesia without any difficulty.   The abdomen was prepped and draped in a sterile fashion. The patient's bladder was emptied.  A Surgical Timeout confirmed our plan.  I made a transverse incision through the inferior umbilical fold.  I made a small transverse nick through the anterior rectus fascia contralateral to the inguinal hernia side and placed a 0-vicryl stitch through the fascia.  I placed a Hasson trocar into the preperitoneal plane.  Entry was clean.  We induced carbon dioxide insufflation. Camera inspection revealed no injury.  I used a 2mm angled scope to bluntly free the peritoneum off the infraumbilical anterior abdominal wall.  I created enough of a preperitoneal pocket to place 12mm ports into the right & left mid-abdomen into this preperitoneal cavity.  I focused attention on the RIGHT pelvis since that was the dominant hernia side.   I used blunt & focused sharp dissection to free the peritoneum off the flank and down to the pubic rim.  I freed the anteriolateral bladder wall off the anteriolateral pelvic wall, sparing midline attachments.   I located a swath of peritoneum going into a hernia fascial defect at the  internal ring consistent with  an indirect inguinal hernia..  I gradually freed the peritoneal hernia sac off safely and reduced it into the preperitoneal space.  I had opened it up and reduce the small bowel out of the scrotum and inguinal canal first.  And to carefully free the hernia sac off the gonadal vessels and vas deferens.  Fortunately, adhesions were not bad to the structures so the reserved without much difficulty.  I came up into the moderately large sac in the inguinal canal and scrotum.  I was able to partially reduce that sac in and free it off.  I avoided over dissection to avoid any vascular compromise.  I freed the peritoneum off the spermatic vessels & vas deferens.  I freed peritoneum off the retroperitoneum along the psoas muscle.  Spermatic cord lipoma was dissected away & removed.  I checked &  assured hemostasis.  I primarily closed the hernia sac peritoneal defect with running 2-0 Vicryl suture using laparoscopic intracorporeal suturing.  That help close the hernia sac down and reduce any redundancy.  I turned attention on the opposite  LEFT pelvis.  I did dissection in a similar, mirror-image fashion. The patient had an indirect inguinal hernia.Marland Kitchen   Spermatic cord lipoma was larger on this side.  It was able to be reduced, dissected away, & removed.    I checked & assured hemostasis.     I chose 15x15 cm sheets of polypropylene mesh (Bard), one for each side.  Because of the larger defect and his recurrence, area on the side of a more dense mesh.  I thought failure with ultra Pro would be rather high.  There is no evidence of any ischemia or infection.  He has been more than a year and a half since his wounds have closed.  I felt it was safe to go ahead and use permanent mesh for more durable repair.  I cut a single sigmoid-shaped slit ~6cm from a corner of each mesh.  I placed the meshes into the preperitoneal space & laid them as overlapping diamonds such that at the inferior points, a 6x6 cm corner flap rested in the true anterolateral pelvis, covering the obturator & femoral foramina.  I allowed the bladder to return to the pubis, this helping tuck the corners of the mesh in the anteriolateral pelvis.  The medial corners overlapped each other across midline cephalad to the pubic rim.   Given the numerous hernias of moderate size, I placed a third 15x15cm mesh in the center as a vertical diamond.  The lateral wings of the mesh overlap across the direct spaces and internal rings where the dominant hernias were.  This provided good coverage and reinforcement of the hernia repairs.  Because of the numerous hernias with a large recurrence on the right, I did place absorbable tacks superior to the pubic rim, underneath the midline rectus sheath, and in the superolateral aspects.  Help tack the mesh  well.  I held the hernia sacs cephalad & evacuated carbon dioxide.  I closed the fascia with absorbable suture.  I closed the skin using 4-0 monocryl stitch.  Sterile dressings were applied.   The patient was extubated & arrived in the PACU in stable condition..  I had discussed postoperative care with the patient in the holding area.  Instructions are written in the chart.  I discussed operative findings, updated the patient's status, discussed probable steps to recovery, and gave postoperative recommendations to the patient's spouse.  Recommendations were made.  Questions were answered.  She expressed understanding & appreciation.   [name], M.D., F.A.C.S. Gastrointestinal and Minimally Invasive Surgery [facility] [address], [place], [place] [address] [phone] Main / Paging  03/04/2017 3:44 PM

## 2017-03-04 NOTE — Discharge Instructions (Signed)
HERNIA REPAIR: POST OP INSTRUCTIONS ° °###################################################################### ° °EAT °Gradually transition to a high fiber diet with a fiber supplement over the next few weeks after discharge.  Start with a pureed / full liquid diet (see below) ° °WALK °Walk an hour a day.  Control your pain to do that.   ° °CONTROL PAIN °Control pain so that you can walk, sleep, tolerate sneezing/coughing, go up/down stairs. ° °HAVE A BOWEL MOVEMENT DAILY °Keep your bowels regular to avoid problems.  OK to try a laxative to override constipation.  OK to use an antidairrheal to slow down diarrhea.  Call if not better after 2 tries ° °CALL IF YOU HAVE PROBLEMS/CONCERNS °Call if you are still struggling despite following these instructions. °Call if you have concerns not answered by these instructions ° °###################################################################### ° ° ° °1. DIET: Follow a light bland diet the first 24 hours after arrival home, such as soup, liquids, crackers, etc.  Be sure to include lots of fluids daily.  Avoid fast food or heavy meals as your are more likely to get nauseated.  Eat a low fat the next few days after surgery. °2. Take your usually prescribed home medications unless otherwise directed. °3. PAIN CONTROL: °a. Pain is best controlled by a usual combination of three different methods TOGETHER: °i. Ice/Heat °ii. Over the counter pain medication °iii. Prescription pain medication °b. Most patients will experience some swelling and bruising around the hernia(s) such as the bellybutton, groins, or old incisions.  Ice packs or heating pads (30-60 minutes up to 6 times a day) will help. Use ice for the first few days to help decrease swelling and bruising, then switch to heat to help relax tight/sore spots and speed recovery.  Some people prefer to use ice alone, heat alone, alternating between ice & heat.  Experiment to what works for you.  Swelling and bruising can take  several weeks to resolve.   °c. It is helpful to take an over-the-counter pain medication regularly for the first few weeks.  Choose one of the following that works best for you: °i. Naproxen (Aleve, etc)  Two 220mg tabs twice a day °ii. Ibuprofen (Advil, etc) Three 200mg tabs four times a day (every meal & bedtime) °iii. Acetaminophen (Tylenol, etc) 325-650mg four times a day (every meal & bedtime) °d. A  prescription for pain medication should be given to you upon discharge.  Take your pain medication as prescribed.  °i. If you are having problems/concerns with the prescription medicine (does not control pain, nausea, vomiting, rash, itching, etc), please call us [phone] to see if we need to switch you to a different pain medicine that will work better for you and/or control your side effect better. °ii. If you need a refill on your pain medication, please contact your pharmacy.  They will contact our office to request authorization. Prescriptions will not be filled after 5 pm or on week-ends. °4. Avoid getting constipated.  Between the surgery and the pain medications, it is common to experience some constipation.  Increasing fluid intake and taking a fiber supplement (such as Metamucil, Citrucel, FiberCon, MiraLax, etc) 1-2 times a day regularly will usually help prevent this problem from occurring.  A mild laxative (prune juice, Milk of Magnesia, MiraLax, etc) should be taken according to package directions if there are no bowel movements after 48 hours.   °5. Wash / shower every day.  You may shower over the dressings as they are waterproof.   °6. Remove   your waterproof bandages 5 days after surgery.  You may leave the incision open to air.  You may replace a dressing/Band-Aid to cover the incision for comfort if you wish.  Continue to shower over incision(s) after the dressing is off. ° ° ° °7. ACTIVITIES as tolerated:   °a. You may resume regular (light) daily activities beginning the next day--such  as daily self-care, walking, climbing stairs--gradually increasing activities as tolerated.  If you can walk 30 minutes without difficulty, it is safe to try more intense activity such as jogging, treadmill, bicycling, low-impact aerobics, swimming, etc. °b. Save the most intensive and strenuous activity for last such as sit-ups, heavy lifting, contact sports, etc  Refrain from any heavy lifting or straining until you are off narcotics for pain control.   °c. DO NOT PUSH THROUGH PAIN.  Let pain be your guide: If it hurts to do something, don't do it.  Pain is your body warning you to avoid that activity for another week until the pain goes down. °d. You may drive when you are no longer taking prescription pain medication, you can comfortably wear a seatbelt, and you can safely maneuver your car and apply brakes. °e. You may have sexual intercourse when it is comfortable.  °8. FOLLOW UP in our office °a. Please call [facility] at [phone] to set up an appointment to see your surgeon in the office for a follow-up appointment approximately 2-3 weeks after your surgery. °b. Make sure that you call for this appointment the day you arrive home to insure a convenient appointment time. °9.  IF YOU HAVE DISABILITY OR FAMILY LEAVE FORMS, BRING THEM TO THE OFFICE FOR PROCESSING.  DO NOT GIVE THEM TO YOUR DOCTOR. ° °WHEN TO CALL US [phone]: °1. Poor pain control °2. Reactions / problems with new medications (rash/itching, nausea, etc)  °3. Fever over 101.5 F (38.5 C) °4. Inability to urinate °5. Nausea and/or vomiting °6. Worsening swelling or bruising °7. Continued bleeding from incision. °8. Increased pain, redness, or drainage from the incision ° ° The clinic staff is available to answer your questions during regular business hours (8:30am-5pm).  Please don’t hesitate to call and ask to speak to one of our nurses for clinical concerns.  ° If you have a medical emergency, go to the nearest emergency room or call  911. ° A surgeon from [facility] is always on call at the hospitals in [place] ° °[facility] °[address], [address], [place], [place]  [address] ? ° [address] [address], [place], [place]   [address] °MAIN: [phone] ? TOLL FREE: [phone] ? FAX: [phone] °[email or web address] ° ° °

## 2017-03-04 NOTE — Progress Notes (Signed)
[facility]  [address] [place], [place] [address] Phone: [phone]  FAX: [phone]      Logan Chang [id number]  CARE TEAM:  PCP: Manfred Shirts, PA  Outpatient Care Team: Patient Care Team: Manfred Shirts, [place] as PCP - General (General Practice)  Inpatient Treatment Team: Treatment Team: Attending Provider: Nolon Nations, MD; Consulting Physician: Edison Pace, Md, MD; Technician: Abbe Amsterdam, NT; Registered Nurse: Efraim Kaufmann, RN; Case Manager: Guadalupe Maple, RN   Problem List:   Active Problems:   Recurrent right inguinal hernia   Hypertension   High cholesterol   Day of Surgery    Assessment  Recurrently incarcerated right inguinal hernia with history of prior wound and mesh infection.  Infection free for 18 months.  Plan:  Laparoscopic exploration and repair of groin hernias found.  Ideally will use permanent mesh again to minimize recurrence since it is a rather large hernia.  Low threshold to use biologic Phasix mesh to minimize infection risk.  Despite his prior wound problem, he is otherwise low risk.  He does not smoke, not a diabetic, not immunosuppressed, no history of other infections.  He is infection free for 18 months.  He would like to take the risks in the hope of minimizing recurrence.  Plan primary repair of the umbilical hernia at the same time.  The anatomy & physiology of the abdominal wall and pelvic floor was discussed.  The pathophysiology of hernias in the inguinal and pelvic region was discussed.  Natural history risks such as progressive enlargement, pain, incarceration, and strangulation was discussed.   Contributors to complications such as smoking, obesity, diabetes, prior surgery, etc were discussed.    I feel the risks of no intervention will lead to serious problems that outweigh the operative risks; therefore, I recommended surgery to reduce and repair the hernia.  I explained  laparoscopic techniques with possible need for an open approach.  I noted usual use of mesh to patch and/or buttress hernia repair  Risks such as bleeding, infection, abscess, need for further treatment, injury to other organs, need for repair of tissues / organs, stroke, heart attack, death, and other risks were discussed.  I noted a good likelihood this will help address the problem.   Goals of post-operative recovery were discussed as well.  Possibility that this will not correct all symptoms was explained.  I stressed the importance of low-impact activity, aggressive pain control, avoiding constipation, & not pushing through pain to minimize risk of post-operative chronic pain or injury. Possibility of reherniation was discussed.  We will work to minimize complications.     An educational handout further explaining the pathology & treatment options was given as well.  Questions were answered.  The patient & wife expresss understanding & wishes to proceed with surgery.  -Hypertension.  Restart Cozaar -Hyperlipidemia.  Restart Zocor. -VTE prophylaxis- SCDs, etc -mobilize as tolerated to help recovery  20 minutes spent in review, evaluation, examination, counseling, and coordination of care.  More than 50% of that time was spent in counseling.  [name], M.D., F.A.C.S. Gastrointestinal and Minimally Invasive Surgery [facility] [address] [place],  [address] [phone] Main / Paging   03/04/2017    Subjective: (Chief complaint)  Wife at bedside.  Hernia out but not as painful.    Objective:  Vital signs:  Vitals:   03/03/17 0631 03/03/17 1303 03/03/17 2231 03/04/17 8101  BP: 100/68 126/77 139/70 (!) 127/92  Pulse: 73 71 72 68  Resp:  16 18 18   Temp: 97.6 F (36.4 C) 98.2 F (36.8 C) 97.9 F (36.6 C) 98.4 F (36.9 C)  TempSrc: Oral Oral Oral Oral  SpO2: 96% 99% 99% 97%  Weight:      Height:        Last BM  Date: 03/01/17  Intake/Output   Yesterday:  11/04 0701 - 11/05 0700 In: 3536 [P.O.:840; I.V.:720; IV Piggyback:110] Out: 1050 [Urine:1050] This shift:  Total I/O In: -  Out: 800 [Urine:800]  Bowel function:  Flatus: YES  BM:  YES  Drain: (No drain)   Physical Exam:  General: Pt awake/alert/oriented x4 in no acute distress Eyes: PERRL, normal EOM.  Sclera clear.  No icterus Neuro: CN II-XII intact w/o focal sensory/motor deficits. Lymph: No head/neck/groin lymphadenopathy Psych:  No delerium/psychosis/paranoia HENT: Normocephalic, Mucus membranes moist.  No thrush Neck: Supple, No tracheal deviation Chest: No chest wall pain w good excursion CV:  Pulses intact.  Regular rhythm MS: Normal AROM mjr joints.  No obvious deformity  Abdomen: Soft.  Nondistended.  Overweight.  3 cm periumbilical mass consistent with incarcerated umbilical hernia.  No evidence of peritonitis.   GU: Large right inguinal hernia going down to scrotum.  Sensitive but ultimately reducible.  Suspicious for small impulse on left side as well. Ext:   No deformity.  No mjr edema.  No cyanosis Skin: No petechiae / purpura  Results:   Labs: Results for orders placed or performed during the hospital encounter of 03/01/17 (from the past 48 hour(s))  Surgical pcr screen     Status: Abnormal   Collection Time: 03/03/17  1:02 PM  Result Value Ref Range   MRSA, PCR POSITIVE (A) NEGATIVE    Comment: RESULT CALLED TO, READ BACK BY AND VERIFIED WITH: P.ARMSTONG 03/03/2017 17:52 JR    Staphylococcus aureus POSITIVE (A) NEGATIVE    Comment: (NOTE) The Xpert SA Assay (FDA approved for NASAL specimens in patients 39 53 years of age and older), is one component of a comprehensive surveillance program. It is not intended to diagnose infection nor to guide or monitor treatment.     Imaging / Studies: No results found.  Medications / Allergies: per chart  Antibiotics: Anti-infectives (From admission, onward)    None        Note: Portions of this report may have been transcribed using voice recognition software. Every effort was made to ensure accuracy; however, inadvertent computerized transcription errors may be present.   Any transcriptional errors that result from this process are unintentional.     [name], M.D., F.A.C.S. Gastrointestinal and Minimally Invasive Surgery [facility] [address], [place], [place] [address] [phone] Main / Paging   03/04/2017

## 2017-03-04 NOTE — Anesthesia Preprocedure Evaluation (Signed)
Anesthesia Evaluation  Patient identified by MRN, date of birth, ID band Patient awake    Reviewed: Allergy & Precautions, NPO status , Patient's Chart, lab work & pertinent test results  Airway Mallampati: II  TM Distance: >3 FB Neck ROM: Full    Dental  (+) Teeth Intact, Dental Advisory Given   Pulmonary neg pulmonary ROS,    Pulmonary exam normal breath sounds clear to auscultation       Cardiovascular hypertension, Pt. on medications Normal cardiovascular exam Rhythm:Regular Rate:Normal  HLD   Neuro/Psych negative neurological ROS  negative psych ROS   GI/Hepatic negative GI ROS, Neg liver ROS,   Endo/Other  negative endocrine ROS  Renal/GU negative Renal ROS     Musculoskeletal negative musculoskeletal ROS (+)   Abdominal   Peds  Hematology negative hematology ROS (+)   Anesthesia Other Findings Day of surgery medications reviewed with the patient.  Reproductive/Obstetrics                             Anesthesia Physical Anesthesia Plan  ASA: II  Anesthesia Plan: General   Post-op Pain Management:    Induction: Intravenous  PONV Risk Score and Plan: 3 and Ondansetron, Dexamethasone and Midazolam  Airway Management Planned: Oral ETT  Additional Equipment:   Intra-op Plan:   Post-operative Plan: Extubation in OR  Informed Consent: I have reviewed the patients History and Physical, chart, labs and discussed the procedure including the risks, benefits and alternatives for the proposed anesthesia with the patient or authorized representative who has indicated his/her understanding and acceptance.   Dental advisory given  Plan Discussed with: CRNA  Anesthesia Plan Comments: (Risks/benefits of general anesthesia discussed with patient including risk of damage to teeth, lips, gum, and tongue, nausea/vomiting, allergic reactions to medications, and the possibility of heart  attack, stroke and death.  All patient questions answered.  Patient wishes to proceed.)        Anesthesia Quick Evaluation

## 2017-03-04 NOTE — Transfer of Care (Signed)
Immediate Anesthesia Transfer of Care Note  Patient: Logan Chang  Procedure(s) Performed: LAPAROSCOPIC BILATERAL INGUINAL HERNIA REPAIR WITH MESH (Bilateral Abdomen) INSERTION OF MESH (Abdomen)  Patient Location: PACU  Anesthesia Type:General  Level of Consciousness: alert , drowsy and patient cooperative  Airway & Oxygen Therapy: Patient Spontanous Breathing and Patient connected to face mask oxygen  Post-op Assessment: Report given to RN, Post -op Vital signs reviewed and stable and Patient moving all extremities  Post vital signs: Reviewed and stable  Last Vitals:  Vitals:   03/03/17 2231 03/04/17 0705  BP: 139/70 (!) 127/92  Pulse: 72 68  Resp: 18 18  Temp: 36.6 C 36.9 C  SpO2: 99% 97%    Last Pain:  Vitals:   03/04/17 1147  TempSrc:   PainSc: 3       Patients Stated Pain Goal: 4 (50/56/97 9480)  Complications: No apparent anesthesia complications

## 2017-03-04 NOTE — Anesthesia Procedure Notes (Signed)
Procedure Name: Intubation Date/Time: 03/04/2017 1:27 PM Performed by: Deliah Boston, CRNA Pre-anesthesia Checklist: Patient identified, Emergency Drugs available, Suction available and Patient being monitored Patient Re-evaluated:Patient Re-evaluated prior to induction Oxygen Delivery Method: Circle system utilized Preoxygenation: Pre-oxygenation with 100% oxygen Induction Type: IV induction Ventilation: Mask ventilation without difficulty Laryngoscope Size: Mac and 3 Grade View: Grade I Tube type: Oral Tube size: 7.5 mm Number of attempts: 1 Airway Equipment and Method: Stylet and Oral airway Placement Confirmation: ETT inserted through vocal cords under direct vision,  positive ETCO2 and breath sounds checked- equal and bilateral Secured at: 22 cm Tube secured with: Tape Dental Injury: Teeth and Oropharynx as per pre-operative assessment

## 2017-03-05 NOTE — Discharge Summary (Signed)
Physician Discharge Summary  Patient ID: Logan Chang MRN: [id number] DOB/AGE: 53-Sep-1967  51 y.o.  Admit date: 03/01/2017 Discharge date: 03/05/2017   Patient Care Team: Manfred Shirts, [place] as PCP - General (General Practice) Michael Boston, MD as Consulting Physician (General Surgery)  Discharge Diagnoses:  Principal Problem:   Incarcerated recurrent right inguinal hernia s/p reduction & lap repair 03/04/2017 Active Problems:   Hypertension   High cholesterol   Left inguinal hernia s/p lap repair 03/04/2017   Incarcerated umbilical hernia s/p primary repair 03/04/2017   1 Day Post-Op  03/04/2017  POST-OPERATIVE DIAGNOSIS:   RECURRENT RIGHT INGUINAL HERNIA, LEFT INGUINAL HERNIA, INCARCERATED UMBILICAL HERNIA  SURGERY:  03/04/2017  Procedure(s): LAPAROSCOPIC BILATERAL INGUINAL HERNIA REPAIR WITH MESH INSERTION OF MESH  SURGEON:    Surgeon(s): Michael Boston, MD  Consults: None  Hospital Course:   Patient with history of incarcerated hernia requiring emergent reduction and primary repair.  Recurrence.  We repair an open fashion with mesh.  Wound infection.  Mesh infection.  Explantation and re-repair with suture.  Recurrent hernia.  Came in incarcerated.  It was able be reduced.  Placed antibiotics.  Recovered.  Recommendation made for laparoscopic approach.  Underwent lap scopic bilateral inguinal hernia repair, especially the giant right inguinal hernia.  Had small umbilical hernia primarily as well.   Postoperatively, the patient gradually mobilized and advanced to a solid diet.  Pain and other symptoms were treated aggressively.    By the time of discharge, the patient was walking well the hallways, eating food, having flatus.  Pain was well-controlled on an oral medications.  Based on meeting discharge criteria and continuing to recover, I felt it was safe for the patient to be discharged from the hospital to further recover with close followup. Postoperative recommendations  were discussed in detail.  They are written as well.  Discharged Condition: good  Disposition:  Follow-up Information    Michael Boston, MD. Schedule an appointment as soon as possible for a visit in 3 week(s).   Specialty:  General Surgery Why:  To follow up after your operation, To follow up after your hospital stay Contact information: [place] [address] [phone]           01-Home or Self Care  Discharge Instructions    Call MD for:   Complete by:  As directed    FEVER > 101.5 F  (temperatures < 101.5 F are not significant)   Call MD for:  extreme fatigue   Complete by:  As directed    Call MD for:  persistant dizziness or light-headedness   Complete by:  As directed    Call MD for:  persistant nausea and vomiting   Complete by:  As directed    Call MD for:  redness, tenderness, or signs of infection (pain, swelling, redness, odor or green/yellow discharge around incision site)   Complete by:  As directed    Call MD for:  severe uncontrolled pain   Complete by:  As directed    Diet - low sodium heart healthy   Complete by:  As directed    Follow a light diet the first few days at home.   Start with a bland diet such as soups, liquids, starchy foods, low fat foods, etc.   If you feel full, bloated, or constipated, stay on a full liquid or pureed/blenderized diet for a few days until you feel better and no longer constipated. Be sure to  drink plenty of fluids every day to avoid getting dehydrated (feeling dizzy, not urinating, etc.). Gradually add a fiber supplement to your diet   Discharge instructions   Complete by:  As directed    See Discharge Instructions If you are not getting better after two weeks or are noticing you are getting worse, contact our office [phone] for further advice.  We may need to adjust your medications, re-evaluate you in the office, send you to the emergency room, or see what other things we can do to  help. The clinic staff is available to answer your questions during regular business hours (8:30am-5pm).  Please don't hesitate to call and ask to speak to one of our nurses for clinical concerns.    A surgeon from [facility] is always on call at the hospitals 24 hours/day If you have a medical emergency, go to the nearest emergency room or call 911.   Driving Restrictions   Complete by:  As directed    You may drive when you are no longer taking narcotic prescription pain medication, you can comfortably wear a seatbelt, and you can safely make sudden turns/stops to protect yourself without hesitating due to pain.   Increase activity slowly   Complete by:  As directed    Start light daily activities --- self-care, walking, climbing stairs- beginning the day after surgery.  Gradually increase activities as tolerated.  Control your pain to be active.  Stop when you are tired.  Ideally, walk several times a day, eventually an hour a day.   Most people are back to most day-to-day activities in a few weeks.  It takes 4-8 weeks to get back to unrestricted, intense activity. If you can walk 30 minutes without difficulty, it is safe to try more intense activity such as jogging, treadmill, bicycling, low-impact aerobics, swimming, etc. Save the most intensive and strenuous activity for last (Usually 4-8 weeks after surgery) such as sit-ups, heavy lifting, contact sports, etc.  Refrain from any intense heavy lifting or straining until you are off narcotics for pain control.  You will have off days, but things should improve week-by-week. DO NOT PUSH THROUGH PAIN.  Let pain be your guide: If it hurts to do something, don't do it.  Pain is your body warning you to avoid that activity for another week until the pain goes down.   Lifting restrictions   Complete by:  As directed    If you can walk 30 minutes without difficulty, it is safe to try more intense activity such as jogging, treadmill,  bicycling, low-impact aerobics, swimming, etc. Save the most intensive and strenuous activity for last (Usually 4-8 weeks after surgery) such as sit-ups, heavy lifting, contact sports, etc.  Refrain from any intense heavy lifting or straining until you are off narcotics for pain control.  You will have off days, but things should improve week-by-week. DO NOT PUSH THROUGH PAIN.  Let pain be your guide: If it hurts to do something, don't do it.  Pain is your body warning you to avoid that activity for another week until the pain goes down.   May walk up steps   Complete by:  As directed    No wound care   Complete by:  As directed    It is good for closed incision and even open wounds to be washed every day.  Shower every day.  Short baths are fine.  Wash the incisions and wounds clean with soap & water.  If you have a closed incision(s), wash the incision with soap & water every day.  You may leave closed incisions open to air if it is dry.   You may cover the incision with clean gauze & replace it after your daily shower for comfort. If you have skin tapes (Steristrips) or skin glue (Dermabond) on your incision, leave them in place.  They will fall off on their own like a scab.  You may trim any edges that curl up with clean scissors.  If you have staples, set up an appointment for them to be removed in the office in 10 days after surgery.  If you have a drain, wash around the skin exit site with soap & water and place a new dressing of gauze or band aid around the skin every day.  Keep the drain site clean & dry.   Sexual Activity Restrictions   Complete by:  As directed    You may have sexual intercourse when it is comfortable. If it hurts to do something, stop.      Allergies as of 03/05/2017      Reactions   Codeine    swelling   Sulfa Antibiotics    unknown      Medication List    TAKE these medications   aspirin EC 81 MG tablet Take 81 mg by mouth daily.   losartan 50 MG  tablet Commonly known as:  COZAAR Take 50 mg by mouth daily.   naproxen 500 MG tablet Commonly known as:  NAPROSYN Take 1 tablet (500 mg total) every 12 (twelve) hours as needed by mouth for mild pain or moderate pain.   oxyCODONE 5 MG immediate release tablet Commonly known as:  Oxy IR/ROXICODONE Take 1-2 tablets (5-10 mg total) every 4 (four) hours as needed by mouth for moderate pain, severe pain or breakthrough pain.   simvastatin 40 MG tablet Commonly known as:  ZOCOR Take 40 mg by mouth daily.       Significant Diagnostic Studies:  Results for orders placed or performed during the hospital encounter of 03/01/17 (from the past 72 hour(s))  Surgical pcr screen     Status: Abnormal   Collection Time: 03/03/17  1:02 PM  Result Value Ref Range   MRSA, PCR POSITIVE (A) NEGATIVE    Comment: RESULT CALLED TO, READ BACK BY AND VERIFIED WITH: P.ARMSTONG 03/03/2017 17:52 JR    Staphylococcus aureus POSITIVE (A) NEGATIVE    Comment: (NOTE) The Xpert SA Assay (FDA approved for NASAL specimens in patients 29 years of age and older), is one component of a comprehensive surveillance program. It is not intended to diagnose infection nor to guide or monitor treatment.     No results found.  Discharge Exam: Blood pressure (!) 146/81, pulse 87, temperature 97.7 F (36.5 C), temperature source Oral, resp. rate 16, height 5\' 8"  (1.727 m), weight 88.5 kg (195 lb), SpO2 98 %.  General: Pt awake/alert/oriented x4 in No acute distress Eyes: PERRL, normal EOM.  Sclera clear.  No icterus Neuro: CN II-XII intact w/o focal sensory/motor deficits. Lymph: No head/neck/groin lymphadenopathy Psych:  No delerium/psychosis/paranoia HENT: Normocephalic, Mucus membranes moist.  No thrush Neck: Supple, No tracheal deviation Chest: No chest wall pain w good excursion CV:  Pulses intact.  Regular rhythm MS: Normal AROM mjr joints.  No obvious deformity Abdomen: Soft.  Nondistended.  Mildly tender  at incisions only.  No evidence of peritonitis.  No incarcerated hernias. Genital: Mild swelling in right groin  and scrotum.  No major ecchymosis.  No recurrent hernias. Ext:  SCDs BLE.  No mjr edema.  No cyanosis Skin: No petechiae / purpura  Past Medical History:  Diagnosis Date  . High cholesterol   . Hypertension     Past Surgical History:  Procedure Laterality Date  . BACK SURGERY    . HERNIA REPAIR    . REPLACEMENT TOTAL HIP W/  RESURFACING IMPLANTS      Social History   Socioeconomic History  . Marital status: Married    Spouse name: Not on file  . Number of children: Not on file  . Years of education: Not on file  . Highest education level: Not on file  Social Needs  . Financial resource strain: Not on file  . Food insecurity - worry: Not on file  . Food insecurity - inability: Not on file  . Transportation needs - medical: Not on file  . Transportation needs - non-medical: Not on file  Occupational History  . Not on file  Tobacco Use  . Smoking status: Never Smoker  . Smokeless tobacco: Never Used  Substance and Sexual Activity  . Alcohol use: Yes  . Drug use: No  . Sexual activity: Not on file  Other Topics Concern  . Not on file  Social History Narrative  . Not on file    History reviewed. No pertinent family history.  Current Facility-Administered Medications  Medication Dose Route Frequency Provider Last Rate Last Dose  . 0.9 %  sodium chloride infusion  250 mL Intravenous PRN Michael Boston, MD      . acetaminophen (TYLENOL) tablet 1,000 mg  1,000 mg Oral TID Michael Boston, MD   1,000 mg at 03/05/17 0940  . alum & mag hydroxide-simeth (MAALOX/MYLANTA) 200-200-20 MG/5ML suspension 30 mL  30 mL Oral Q6H PRN Michael Boston, MD      . aspirin EC tablet 81 mg  81 mg Oral Daily Michael Boston, MD   81 mg at 03/05/17 0940  . Chlorhexidine Gluconate Cloth 2 % PADS 6 each  6 each Topical Q0600 Excell Seltzer, MD   6 each at 03/04/17 1120  .  diphenhydrAMINE (BENADRYL) injection 12.5-25 mg  12.5-25 mg Intravenous Q6H PRN Michael Boston, MD      . feeding supplement (ENSURE ENLIVE) (ENSURE ENLIVE) liquid 237 mL  237 mL Oral BID BM Jackolyn Confer, MD   237 mL at 03/03/17 1027  . gabapentin (NEURONTIN) capsule 300 mg  300 mg Oral Ardeen Fillers, MD   300 mg at 03/04/17 2051  . guaiFENesin-dextromethorphan (ROBITUSSIN DM) 100-10 MG/5ML syrup 10 mL  10 mL Oral Q4H PRN Michael Boston, MD      . hydrALAZINE (APRESOLINE) injection 5-20 mg  5-20 mg Intravenous Q6H PRN Michael Boston, MD   20 mg at 03/04/17 2049  . hydrocortisone (ANUSOL-HC) 2.5 % rectal cream 1 application  1 application Topical QID PRN Michael Boston, MD      . hydrocortisone cream 1 % 1 application  1 application Topical TID PRN Minda Ditto, RPH      . HYDROmorphone (DILAUDID) injection 0.5-2 mg  0.5-2 mg Intravenous Q2H PRN Michael Boston, MD   2 mg at 03/04/17 2131  . ibuprofen (ADVIL,MOTRIN) tablet 800 mg  800 mg Oral Q8H Jackolyn Confer, MD   800 mg at 03/05/17 0534  . lactated ringers bolus 1,000 mL  1,000 mL Intravenous Q8H PRN Michael Boston, MD      . lip balm (CARMEX) ointment  1 application  1 application Topical BID Michael Boston, MD   1 application at 95/18/84 [phone]  . losartan (COZAAR) tablet 50 mg  50 mg Oral Daily Michael Boston, MD   50 mg at 03/05/17 0940  . magic mouthwash  15 mL Oral QID PRN Michael Boston, MD      . menthol-cetylpyridinium (CEPACOL) lozenge 3 mg  1 lozenge Oral PRN Michael Boston, MD      . methocarbamol (ROBAXIN) 1,000 mg in dextrose 5 % 50 mL IVPB  1,000 mg Intravenous Q6H PRN Michael Boston, MD      . methocarbamol (ROBAXIN) 500 mg in dextrose 5 % 50 mL IVPB  500 mg Intravenous Q6H Jackolyn Confer, MD 110 mL/hr at 03/05/17 0534 500 mg at 03/05/17 0534  . methocarbamol (ROBAXIN) tablet 1,000 mg  1,000 mg Oral Q6H PRN Michael Boston, MD   1,000 mg at 03/05/17 0940  . metoprolol tartrate (LOPRESSOR) injection 5 mg  5 mg Intravenous Q6H PRN  Michael Boston, MD      . mupirocin ointment (BACTROBAN) 2 % 1 application  1 application Nasal BID Excell Seltzer, MD   1 application at 63/01/60 0940  . ondansetron (ZOFRAN-ODT) disintegrating tablet 4 mg  4 mg Oral Q6H PRN Jackolyn Confer, MD       Or  . ondansetron (ZOFRAN) injection 4 mg  4 mg Intravenous Q6H PRN Rosenbower, Sherren Mocha, MD      . oxyCODONE (Oxy IR/ROXICODONE) immediate release tablet 5-10 mg  5-10 mg Oral Q4H PRN Michael Boston, MD   10 mg at 03/05/17 1150  . phenol (CHLORASEPTIC) mouth spray 1-2 spray  1-2 spray Mouth/Throat PRN Michael Boston, MD      . polyethylene glycol (MIRALAX / GLYCOLAX) packet 17 g  17 g Oral Daily Harrel Ferrone, Remo Lipps, MD      . polyethylene glycol (MIRALAX / GLYCOLAX) packet 17 g  17 g Oral Q12H PRN Michael Boston, MD      . prochlorperazine (COMPAZINE) injection 5-10 mg  5-10 mg Intravenous Q4H PRN Michael Boston, MD      . simvastatin (ZOCOR) tablet 40 mg  40 mg Oral Daily Michael Boston, MD   40 mg at 03/05/17 0940  . sodium chloride flush (NS) 0.9 % injection 3 mL  3 mL Intravenous Gorden Harms, MD   3 mL at 03/05/17 0942  . sodium chloride flush (NS) 0.9 % injection 3 mL  3 mL Intravenous PRN Michael Boston, MD       Current Outpatient Medications  Medication Sig Dispense Refill  . aspirin EC 81 MG tablet Take 81 mg by mouth daily.     Marland Kitchen losartan (COZAAR) 50 MG tablet Take 50 mg by mouth daily.  3  . simvastatin (ZOCOR) 40 MG tablet Take 40 mg by mouth daily.     . naproxen (NAPROSYN) 500 MG tablet Take 1 tablet (500 mg total) every 12 (twelve) hours as needed by mouth for mild pain or moderate pain. 30 tablet 1  . oxyCODONE (OXY IR/ROXICODONE) 5 MG immediate release tablet Take 1-2 tablets (5-10 mg total) every 4 (four) hours as needed by mouth for moderate pain, severe pain or breakthrough pain. 40 tablet 0     Allergies  Allergen Reactions  . Codeine     swelling  . Sulfa Antibiotics     unknown    Signed: Morton Peters, M.D., F.A.C.S. Gastrointestinal and Minimally Invasive Surgery [facility] [address],  [address] [place], [place] [address] [phone] Main / Paging   03/05/2017, 1:43 PM

## 2017-03-05 NOTE — Progress Notes (Signed)
Pt d/c to home. PIV removed without complication. Discharge orders, follow up visits, and prescriptions reviewed with pt. Pt escorted off of unit via wheelchair to care and car of wife.

## 2017-04-26 ENCOUNTER — Ambulatory Visit (HOSPITAL_COMMUNITY)
Admission: RE | Admit: 2017-04-26 | Discharge: 2017-04-26 | Disposition: A | Discharge status: Home / Self Care | Payer: 59 | Source: Ambulatory Visit | Attending: Student | Admitting: Student

## 2017-04-26 ENCOUNTER — Other Ambulatory Visit (HOSPITAL_COMMUNITY): Payer: Self-pay | Admitting: Student

## 2017-04-26 DIAGNOSIS — N50819 Testicular pain, unspecified: Secondary | ICD-10-CM | POA: Diagnosis present

## 2017-04-26 DIAGNOSIS — I861 Scrotal varices: Secondary | ICD-10-CM | POA: Diagnosis not present

## 2017-05-02 ENCOUNTER — Ambulatory Visit (HOSPITAL_COMMUNITY)
Admission: RE | Admit: 2017-05-02 | Discharge: 2017-05-02 | Disposition: A | Discharge status: Home / Self Care | Payer: Worker's Compensation | Source: Ambulatory Visit | Attending: Surgery | Admitting: Surgery

## 2017-05-02 ENCOUNTER — Other Ambulatory Visit (HOSPITAL_COMMUNITY): Payer: Self-pay | Admitting: Surgery

## 2017-05-02 ENCOUNTER — Encounter: Payer: Self-pay | Admitting: Surgery

## 2017-05-02 DIAGNOSIS — N200 Calculus of kidney: Secondary | ICD-10-CM | POA: Insufficient documentation

## 2017-05-02 DIAGNOSIS — I700 Atherosclerosis of aorta: Secondary | ICD-10-CM | POA: Insufficient documentation

## 2017-05-02 DIAGNOSIS — N50819 Testicular pain, unspecified: Secondary | ICD-10-CM

## 2017-05-02 MED ORDER — IOPAMIDOL (ISOVUE-300) INJECTION 61%
100.0000 mL | Freq: Once | INTRAVENOUS | Status: AC | PRN
  Administered 2017-05-02: 100 mL via INTRAVENOUS

## 2017-05-03 ENCOUNTER — Ambulatory Visit (HOSPITAL_COMMUNITY)
Admission: RE | Admit: 2017-05-03 | Discharge: 2017-05-03 | Disposition: A | Discharge status: Home / Self Care | Payer: Worker's Compensation | Source: Ambulatory Visit | Attending: Surgery | Admitting: Surgery

## 2017-05-03 ENCOUNTER — Encounter (HOSPITAL_COMMUNITY): Payer: Self-pay

## 2017-05-03 DIAGNOSIS — E7800 Pure hypercholesterolemia, unspecified: Secondary | ICD-10-CM | POA: Insufficient documentation

## 2017-05-03 DIAGNOSIS — I10 Essential (primary) hypertension: Secondary | ICD-10-CM | POA: Insufficient documentation

## 2017-05-03 DIAGNOSIS — R188 Other ascites: Secondary | ICD-10-CM | POA: Insufficient documentation

## 2017-05-03 DIAGNOSIS — Z79899 Other long term (current) drug therapy: Secondary | ICD-10-CM | POA: Insufficient documentation

## 2017-05-03 DIAGNOSIS — Z7982 Long term (current) use of aspirin: Secondary | ICD-10-CM | POA: Diagnosis not present

## 2017-05-03 DIAGNOSIS — Z9889 Other specified postprocedural states: Secondary | ICD-10-CM | POA: Diagnosis not present

## 2017-05-03 DIAGNOSIS — Z96642 Presence of left artificial hip joint: Secondary | ICD-10-CM | POA: Diagnosis not present

## 2017-05-03 DIAGNOSIS — I700 Atherosclerosis of aorta: Secondary | ICD-10-CM | POA: Insufficient documentation

## 2017-05-03 DIAGNOSIS — N50819 Testicular pain, unspecified: Secondary | ICD-10-CM | POA: Diagnosis present

## 2017-05-03 LAB — CBC
HCT: 46.6 % (ref 39.0–52.0)
Hemoglobin: 15.6 g/dL (ref 13.0–17.0)
MCH: 29.8 pg (ref 26.0–34.0)
MCHC: 33.5 g/dL (ref 30.0–36.0)
MCV: 89.1 fL (ref 78.0–100.0)
PLATELETS: 265 10*3/uL (ref 150–400)
RBC: 5.23 MIL/uL (ref 4.22–5.81)
RDW: 12.4 % (ref 11.5–15.5)
WBC: 7.9 10*3/uL (ref 4.0–10.5)

## 2017-05-03 LAB — PROTIME-INR
INR: 1.02
PROTHROMBIN TIME: 13.3 s (ref 11.4–15.2)

## 2017-05-03 MED ORDER — FENTANYL CITRATE (PF) 100 MCG/2ML IJ SOLN
INTRAMUSCULAR | Status: AC
  Filled 2017-05-03: qty 4

## 2017-05-03 MED ORDER — FENTANYL CITRATE (PF) 100 MCG/2ML IJ SOLN
INTRAMUSCULAR | Status: AC | PRN
  Administered 2017-05-03 (×2): 50 ug via INTRAVENOUS

## 2017-05-03 MED ORDER — LIDOCAINE HCL (PF) 1 % IJ SOLN
INTRAMUSCULAR | Status: AC
  Filled 2017-05-03: qty 30

## 2017-05-03 MED ORDER — SODIUM CHLORIDE 0.9 % IV SOLN
INTRAVENOUS | Status: DC
  Administered 2017-05-03: 14:00:00 via INTRAVENOUS

## 2017-05-03 MED ORDER — MIDAZOLAM HCL 2 MG/2ML IJ SOLN
INTRAMUSCULAR | Status: AC | PRN
  Administered 2017-05-03 (×3): 1 mg via INTRAVENOUS

## 2017-05-03 MED ORDER — MIDAZOLAM HCL 2 MG/2ML IJ SOLN
INTRAMUSCULAR | Status: AC
  Filled 2017-05-03: qty 4

## 2017-05-03 NOTE — Procedures (Signed)
US guided aspiration of right inguinal fluid collection.  Removed 110 ml of dark red fluid.  90% of fluid was able to be removed.  Minimal blood loss and no immediate complication.

## 2017-05-03 NOTE — Sedation Documentation (Signed)
Patient is resting comfortably. 

## 2017-05-03 NOTE — H&P (Signed)
Chief Complaint: Testicular pain  Referring Physician(s): Gross,Steven  Supervising Physician: Markus Daft  Patient Status: [facility] - Out-pt  History of Present Illness: Logan Chang is a 52 y.o. adult who underwent incarcerated recurrent right inguinal hernia s/p reduction & lap repair 03/04/2017 by Dr. Johney Maine.  He complained of right testicular pain which started around 12/20.  CT scan was obtained yesterday which revealed Complex fluid collection within the right inguinal canal, likely hematoma with possible superinfection.  We are asked to aspirate the fluid collection.  He is NPO. He does not take blood thinners.  Past Medical History:  Diagnosis Date  . High cholesterol   . Hypertension     Past Surgical History:  Procedure Laterality Date  . BACK SURGERY    . HERNIA MESH REMOVAL  2016   removal of infected meshhad right inguinal hernia.  Open wound.  Closed August 2016  . INGUINAL HERNIA REPAIR Bilateral 03/04/2017   Procedure: LAPAROSCOPIC BILATERAL INGUINAL HERNIA REPAIR WITH MESH;  Surgeon: Michael Boston, MD;  Location: [facility];  Service: General;  Laterality: Bilateral;  . INGUINAL HERNIA REPAIR  2015   emergent open repair for incarcerated right inguinal hernia  . INGUINAL HERNIA REPAIR Right 2016   open repair with mesh for recurrent right inguinal hernia.  [facility] [place]  03/04/2017   Procedure: INSERTION OF MESH;  Surgeon: Michael Boston, MD;  Location: [facility];  Service: General;;  . REPLACEMENT TOTAL HIP W/  RESURFACING IMPLANTS      Allergies: Codeine and Sulfa antibiotics  Medications: Prior to Admission medications   Medication Sig Start Date End Date Taking? Authorizing Provider  aspirin EC 81 MG tablet Take 81 mg by mouth daily.  06/15/14   [provider]  losartan (COZAAR) 50 MG tablet Take 50 mg by mouth daily. 12/15/16   [provider]  naproxen (NAPROSYN) 500 MG tablet Take 1 tablet (500 mg  total) every 12 (twelve) hours as needed by mouth for mild pain or moderate pain. 03/04/17   Michael Boston, MD  oxyCODONE (OXY IR/ROXICODONE) 5 MG immediate release tablet Take 1-2 tablets (5-10 mg total) every 4 (four) hours as needed by mouth for moderate pain, severe pain or breakthrough pain. 03/04/17   Michael Boston, MD  simvastatin (ZOCOR) 40 MG tablet Take 40 mg by mouth daily.  02/15/17   [provider]     History reviewed. No pertinent family history.  Social History   Socioeconomic History  . Marital status: Married    Spouse name: None  . Number of children: None  . Years of education: None  . Highest education level: None  Social Needs  . Financial resource strain: None  . Food insecurity - worry: None  . Food insecurity - inability: None  . Transportation needs - medical: None  . Transportation needs - non-medical: None  Occupational History  . None  Tobacco Use  . Smoking status: Never Smoker  . Smokeless tobacco: Never Used  Substance and Sexual Activity  . Alcohol use: Yes  . Drug use: No  . Sexual activity: None  Other Topics Concern  . None  Social History Narrative  . None    Review of Systems: A 12 point ROS discussed  Review of Systems  Constitutional: Positive for activity change.  HENT: Negative.   Respiratory: Negative.   Cardiovascular: Negative.   Gastrointestinal: Negative.   Genitourinary: Negative.        Swelling  right inguinal region.  Musculoskeletal: Negative.   Skin: Negative.   Neurological: Negative.   Hematological: Negative.   Psychiatric/Behavioral: Negative.     Vital Signs: BP 117/79   Pulse 83   Temp 98 F (36.7 C) (Oral)   SpO2 98%   Physical Exam  Constitutional: He is oriented to person, place, and time. He appears well-developed.  HENT:  Head: Normocephalic.  Eyes: EOM are normal.  Neck: Normal range of motion.  Cardiovascular: Normal rate, regular rhythm and normal heart sounds.  Pulmonary/Chest:  Effort normal and breath sounds normal. No stridor. No respiratory distress.  Abdominal: Soft.  Genitourinary:     Genitourinary Comments: Fluid collection right inguinal canal  Musculoskeletal: Normal range of motion.  Neurological: He is alert and oriented to person, place, and time.  Skin: Skin is warm and dry.  Psychiatric: He has a normal mood and affect. His behavior is normal. Judgment and thought content normal.  Vitals reviewed.   Imaging: Ct Abdomen Pelvis W Contrast  Result Date: 05/02/2017 CLINICAL DATA:  Right testicular pain since 04/18/2013. Umbilical and bilateral inguinal hernia repair with mesh 03/04/2017. EXAM: CT ABDOMEN AND PELVIS WITH CONTRAST TECHNIQUE: Multidetector CT imaging of the abdomen and pelvis was performed using the standard protocol following bolus administration of intravenous contrast. CONTRAST:  177mL ISOVUE-300 IOPAMIDOL (ISOVUE-300) INJECTION 61% COMPARISON:  Scrotal ultrasound of 04/26/2017. Most recent CT of 10/16/2014. FINDINGS: Lower chest: Lingular scarring. Normal heart size without pericardial or pleural effusion. Hepatobiliary: Normal liver. Normal gallbladder, without biliary ductal dilatation. Pancreas: Normal, without mass or ductal dilatation. Spleen: Normal in size, without focal abnormality. Adrenals/Urinary Tract: Normal adrenal glands. Bilateral punctate renal collecting system calculi. Bilateral too small to characterize renal lesions. Degraded evaluation of the pelvis, secondary to beam hardening artifact from left hip arthroplasty. Grossly normal urinary bladder. Stomach/Bowel: Normal stomach, without wall thickening. Scattered colonic diverticula. Normal terminal ileum and appendix. Normal small bowel. Vascular/Lymphatic: Advanced aortic and branch vessel atherosclerosis. Minimal motion degradation within the mid abdomen. No abdominopelvic adenopathy. Reproductive: Normal prostate. Other: No significant free fluid. Interval ventral lower  abdominal and upper pelvic mesh placement for umbilical hernia repair. Fluid throughout the right inguinal canal with peripheral wall thickening and hyper enhancement or hyperattenuation. Measures maximally 6.1 x 5.4 cm transverse on image 104/ series 2. 12.7 cm craniocaudal on coronal image 21/ series 604. Musculoskeletal: Left hip arthroplasty. Vertebral augmentation at L1 IMPRESSION: 1. Complex fluid collection within the right inguinal canal, likely hematoma with possible superinfection. 2. Bilateral nephrolithiasis. 3.  Aortic Atherosclerosis (ICD10-I70.0). Electronically Signed   By: Abigail Miyamoto M.D.   On: 05/02/2017 13:14   [place] Scrotom W/doppler  Result Date: 04/26/2017 CLINICAL DATA:  Testicular pain. Status post laparoscopic bilateral inguinal hernia repair with mesh on 03/04/2017. EXAM: SCROTAL ULTRASOUND DOPPLER ULTRASOUND OF THE TESTICLES TECHNIQUE: Complete ultrasound examination of the testicles, epididymis, and other scrotal structures was performed. Color and spectral Doppler ultrasound were also utilized to evaluate blood flow to the testicles. COMPARISON:  None. FINDINGS: Right testicle Measurements: 5.3 x 2.0 x 3.5 cm. No mass or microlithiasis visualized. There is a large complex fluid collection in the region of the right inguinal canal extending down into the right hemiscrotum. This collection has been incompletely visualized by ultrasound but measures 15.9 x 3.4 x 6.2 cm. Left testicle Measurements: 4.5 x 2.4 x 2.7 cm. No mass or microlithiasis visualized. Right epididymis:  Normal in size and appearance. Left epididymis:  Tiny cyst or spermatocele. Hydrocele:  None visualized.  Varicocele:  Left-sided. Pulsed Doppler interrogation of both testes demonstrates normal low resistance arterial and venous waveforms bilaterally. IMPRESSION: 1. Large complex fluid collection in the region of the right inguinal canal contains diffuse low level internal echoes and extends down into the hemiscrotum.  Typical reticulation of chronic hematoma is not visualized. Nevertheless, this could represent old blood products or a serous collection with internal debris. Superinfection of this fluid cannot be excluded by ultrasound. No bowel could be visualized with Valsalva maneuver, but recurrent hernia is also possibility. CT imaging of the pelvis may prove helpful to further evaluate. If CT is performed, use of intravenous contrast material is suggested. 2. Left varicocele. These results will be called to the ordering clinician or representative by the Radiologist Assistant, and communication documented in the PACS or zVision Dashboard. Electronically Signed   By: Misty Stanley M.D.   On: 04/26/2017 18:15    Labs:  CBC: Recent Labs    03/01/17 1148 03/02/17 0507 05/03/17 1410  WBC 8.3 7.9 7.9  HGB 15.0 14.9 15.6  HCT 44.3 45.6 46.6  PLT 255 236 265    COAGS: No results for input(s): INR, APTT in the last 8760 hours.  BMP: Recent Labs    03/01/17 1148  NA 138  K 4.0  CL 106  CO2 24  GLUCOSE 110*  BUN 16  CALCIUM 9.5  CREATININE 0.98  GFRNONAA >60  GFRAA >60    LIVER FUNCTION TESTS: No results for input(s): BILITOT, AST, ALT, ALKPHOS, PROT, ALBUMIN in the last 8760 hours.  TUMOR MARKERS: No results for input(s): AFPTM, CEA, CA199, CHROMGRNA in the last 8760 hours.  Assessment and Plan: S/P right hernia repair now with a complex fluid collection within the right inguinal canal, likely hematoma with possible superinfection.  Will proceed with image guided aspiration today by Dr. Anselm Pancoast.  Risks and benefits discussed with the patient including bleeding, infection, damage to adjacent structures, bowel perforation/fistula connection, and sepsis.  All of the patient's questions were answered, patient is agreeable to proceed. Consent signed and in chart.   Thank you for this interesting consult.  I greatly enjoyed meeting JAQUES MINEER and look forward to participating in their  care.  A copy of this report was sent to the requesting provider on this date.  Electronically Signed: Murrell Redden, PA-C 05/03/2017, 2:27 PM   I spent a total of  30 Minutes in face to face in clinical consultation, greater than 50% of which was counseling/coordinating care for aspiration of groin fluid collection.

## 2017-05-03 NOTE — Sedation Documentation (Signed)
Discharge instructions reviewed, verbalized understanding, all questions answered. Patient escorted to main lobby with wife for discharge.

## 2017-05-03 NOTE — Discharge Instructions (Signed)
Moderate Conscious Sedation, Adult, Care After These instructions provide you with information about caring for yourself after your procedure. Your health care provider may also give you more specific instructions. Your treatment has been planned according to current medical practices, but problems sometimes occur. Call your health care provider if you have any problems or questions after your procedure. What can I expect after the procedure? After your procedure, it is common:  To feel sleepy for several hours.  To feel clumsy and have poor balance for several hours.  To have poor judgment for several hours.  To vomit if you eat too soon.  Follow these instructions at home: For at least 24 hours after the procedure:   Do not: ? Participate in activities where you could fall or become injured. ? Drive. ? Use heavy machinery. ? Drink alcohol. ? Take sleeping pills or medicines that cause drowsiness. ? Make important decisions or sign legal documents. ? Take care of children on your own.  Rest. Eating and drinking  Follow the diet recommended by your health care provider.  If you vomit: ? Drink water, juice, or soup when you can drink without vomiting. ? Make sure you have little or no nausea before eating solid foods. General instructions  Have a responsible adult stay with you until you are awake and alert.  Take over-the-counter and prescription medicines only as told by your health care provider.  If you smoke, do not smoke without supervision.  Keep all follow-up visits as told by your health care provider. This is important. Contact a health care provider if:  You keep feeling nauseous or you keep vomiting.  You feel light-headed.  You develop a rash.  You have a fever. Get help right away if:  You have trouble breathing. This information is not intended to replace advice given to you by your health care provider. Make sure you discuss any questions you have  with your health care provider. Document Released: 02/04/2013 Document Revised: 09/19/2015 Document Reviewed: 08/06/2015 Elsevier Interactive Patient Education  2018 [employer]. Moderate Conscious Sedation, Adult Sedation is the use of medicines to promote relaxation and relieve discomfort and anxiety. Moderate conscious sedation is a type of sedation. Under moderate conscious sedation, you are less alert than normal, but you are still able to respond to instructions, touch, or both. Moderate conscious sedation is used during short medical and dental procedures. It is milder than deep sedation, which is a type of sedation under which you cannot be easily woken up. It is also milder than general anesthesia, which is the use of medicines to make you unconscious. Moderate conscious sedation allows you to return to your regular activities sooner. Tell a health care provider about:  Any allergies you have.  All medicines you are taking, including vitamins, herbs, eye drops, creams, and over-the-counter medicines.  Use of steroids (by mouth or creams).  Any problems you or family members have had with sedatives and anesthetic medicines.  Any blood disorders you have.  Any surgeries you have had.  Any medical conditions you have, such as sleep apnea.  Whether you are pregnant or may be pregnant.  Any use of cigarettes, alcohol, marijuana, or street drugs. What are the risks? Generally, this is a safe procedure. However, problems may occur, including:  Getting too much medicine (oversedation).  Nausea.  Allergic reaction to medicines.  Trouble breathing. If this happens, a breathing tube may be used to help with breathing. It will be removed when you  are awake and breathing on your own.  Heart trouble.  Lung trouble.  What happens before the procedure? Staying hydrated Follow instructions from your health care provider about hydration, which may include:  Up to 2 hours before  the procedure - you may continue to drink clear liquids, such as water, clear fruit juice, black coffee, and plain tea.  Eating and drinking restrictions Follow instructions from your health care provider about eating and drinking, which may include:  8 hours before the procedure - stop eating heavy meals or foods such as meat, fried foods, or fatty foods.  6 hours before the procedure - stop eating light meals or foods, such as toast or cereal.  6 hours before the procedure - stop drinking milk or drinks that contain milk.  2 hours before the procedure - stop drinking clear liquids.  Medicine  Ask your health care provider about:  Changing or stopping your regular medicines. This is especially important if you are taking diabetes medicines or blood thinners.  Taking medicines such as aspirin and ibuprofen. These medicines can thin your blood. Do not take these medicines before your procedure if your health care provider instructs you not to.  Tests and exams  You will have a physical exam.  You may have blood tests done to show: ? How well your kidneys and liver are working. ? How well your blood can clot. General instructions  Plan to have someone take you home from the hospital or clinic.  If you will be going home right after the procedure, plan to have someone with you for 24 hours. What happens during the procedure?  An IV tube will be inserted into one of your veins.  Medicine to help you relax (sedative) will be given through the IV tube.  The medical or dental procedure will be performed. What happens after the procedure?  Your blood pressure, heart rate, breathing rate, and blood oxygen level will be monitored often until the medicines you were given have worn off.  Do not drive for 24 hours. This information is not intended to replace advice given to you by your health care provider. Make sure you discuss any questions you have with your health care  provider. Document Released: 01/09/2001 Document Revised: 09/20/2015 Document Reviewed: 08/06/2015 Elsevier Interactive Patient Education  [employer].

## 2017-05-04 LAB — GRAM STAIN

## 2017-05-09 LAB — CULTURE, BODY FLUID W GRAM STAIN -BOTTLE

## 2017-05-20 ENCOUNTER — Other Ambulatory Visit: Payer: Self-pay

## 2017-05-20 ENCOUNTER — Inpatient Hospital Stay (HOSPITAL_COMMUNITY)
Admission: EM | Admit: 2017-05-20 | Discharge: 2017-05-21 | DRG: 392 | Disposition: A | Discharge status: Home / Self Care | Payer: Worker's Compensation | Attending: Surgery | Admitting: Surgery

## 2017-05-20 ENCOUNTER — Inpatient Hospital Stay (HOSPITAL_COMMUNITY): Payer: Worker's Compensation

## 2017-05-20 ENCOUNTER — Encounter (HOSPITAL_COMMUNITY): Payer: Self-pay | Admitting: *Deleted

## 2017-05-20 DIAGNOSIS — E7800 Pure hypercholesterolemia, unspecified: Secondary | ICD-10-CM | POA: Diagnosis present

## 2017-05-20 DIAGNOSIS — Z7982 Long term (current) use of aspirin: Secondary | ICD-10-CM

## 2017-05-20 DIAGNOSIS — S301XXD Contusion of abdominal wall, subsequent encounter: Secondary | ICD-10-CM

## 2017-05-20 DIAGNOSIS — Z885 Allergy status to narcotic agent status: Secondary | ICD-10-CM | POA: Diagnosis not present

## 2017-05-20 DIAGNOSIS — R1909 Other intra-abdominal and pelvic swelling, mass and lump: Secondary | ICD-10-CM

## 2017-05-20 DIAGNOSIS — Z96649 Presence of unspecified artificial hip joint: Secondary | ICD-10-CM | POA: Diagnosis present

## 2017-05-20 DIAGNOSIS — R1031 Right lower quadrant pain: Secondary | ICD-10-CM | POA: Diagnosis present

## 2017-05-20 DIAGNOSIS — Z79899 Other long term (current) drug therapy: Secondary | ICD-10-CM

## 2017-05-20 DIAGNOSIS — I10 Essential (primary) hypertension: Secondary | ICD-10-CM | POA: Diagnosis present

## 2017-05-20 DIAGNOSIS — S3022XD Contusion of scrotum and testes, subsequent encounter: Secondary | ICD-10-CM

## 2017-05-20 DIAGNOSIS — Z882 Allergy status to sulfonamides status: Secondary | ICD-10-CM | POA: Diagnosis not present

## 2017-05-20 DIAGNOSIS — R112 Nausea with vomiting, unspecified: Secondary | ICD-10-CM

## 2017-05-20 DIAGNOSIS — F411 Generalized anxiety disorder: Secondary | ICD-10-CM

## 2017-05-20 DIAGNOSIS — K529 Noninfective gastroenteritis and colitis, unspecified: Principal | ICD-10-CM | POA: Diagnosis present

## 2017-05-20 DIAGNOSIS — T8579XA Infection and inflammatory reaction due to other internal prosthetic devices, implants and grafts, initial encounter: Secondary | ICD-10-CM

## 2017-05-20 DIAGNOSIS — Z9889 Other specified postprocedural states: Secondary | ICD-10-CM | POA: Diagnosis not present

## 2017-05-20 DIAGNOSIS — X58XXXD Exposure to other specified factors, subsequent encounter: Secondary | ICD-10-CM | POA: Diagnosis present

## 2017-05-20 DIAGNOSIS — K4090 Unilateral inguinal hernia, without obstruction or gangrene, not specified as recurrent: Secondary | ICD-10-CM

## 2017-05-20 DIAGNOSIS — R197 Diarrhea, unspecified: Secondary | ICD-10-CM

## 2017-05-20 DIAGNOSIS — K420 Umbilical hernia with obstruction, without gangrene: Secondary | ICD-10-CM | POA: Diagnosis present

## 2017-05-20 DIAGNOSIS — K4030 Unilateral inguinal hernia, with obstruction, without gangrene, not specified as recurrent: Secondary | ICD-10-CM | POA: Diagnosis present

## 2017-05-20 HISTORY — DX: Infection following a procedure, other surgical site, initial encounter: T81.49XA

## 2017-05-20 HISTORY — DX: Infection and inflammatory reaction due to other internal prosthetic devices, implants and grafts, initial encounter: T85.79XA

## 2017-05-20 LAB — URINALYSIS, ROUTINE W REFLEX MICROSCOPIC
Bilirubin Urine: NEGATIVE
GLUCOSE, UA: NEGATIVE mg/dL
HGB URINE DIPSTICK: NEGATIVE
Ketones, ur: NEGATIVE mg/dL
LEUKOCYTES UA: NEGATIVE
Nitrite: NEGATIVE
PROTEIN: NEGATIVE mg/dL
SPECIFIC GRAVITY, URINE: 1.044 — AB (ref 1.005–1.030)
pH: 5 (ref 5.0–8.0)

## 2017-05-20 LAB — COMPREHENSIVE METABOLIC PANEL
ALT: 59 U/L (ref 17–63)
ANION GAP: 12 (ref 5–15)
AST: 34 U/L (ref 15–41)
Albumin: 4.2 g/dL (ref 3.5–5.0)
Alkaline Phosphatase: 90 U/L (ref 38–126)
BUN: 10 mg/dL (ref 6–20)
CHLORIDE: 105 mmol/L (ref 101–111)
CO2: 21 mmol/L — AB (ref 22–32)
Calcium: 9.4 mg/dL (ref 8.9–10.3)
Creatinine, Ser: 0.83 mg/dL (ref 0.61–1.24)
Glucose, Bld: 98 mg/dL (ref 65–99)
POTASSIUM: 4 mmol/L (ref 3.5–5.1)
SODIUM: 138 mmol/L (ref 135–145)
Total Bilirubin: 0.8 mg/dL (ref 0.3–1.2)
Total Protein: 7.3 g/dL (ref 6.5–8.1)

## 2017-05-20 LAB — PROTIME-INR
INR: 1.02
Prothrombin Time: 13.3 seconds (ref 11.4–15.2)

## 2017-05-20 LAB — I-STAT CG4 LACTIC ACID, ED: LACTIC ACID, VENOUS: 1.88 mmol/L (ref 0.5–1.9)

## 2017-05-20 LAB — CBC
HEMATOCRIT: 48 % (ref 39.0–52.0)
Hemoglobin: 16.3 g/dL (ref 13.0–17.0)
MCH: 30.8 pg (ref 26.0–34.0)
MCHC: 34 g/dL (ref 30.0–36.0)
MCV: 90.6 fL (ref 78.0–100.0)
PLATELETS: 263 10*3/uL (ref 150–400)
RBC: 5.3 MIL/uL (ref 4.22–5.81)
RDW: 12.8 % (ref 11.5–15.5)
WBC: 9.3 10*3/uL (ref 4.0–10.5)

## 2017-05-20 LAB — LIPASE, BLOOD: LIPASE: 36 U/L (ref 11–51)

## 2017-05-20 MED ORDER — DIPHENHYDRAMINE HCL 50 MG/ML IJ SOLN: 25.0000 mg | Freq: Four times a day (QID) | INTRAMUSCULAR | Status: DC | PRN

## 2017-05-20 MED ORDER — NAPROXEN 250 MG PO TABS: 500.0000 mg | ORAL_TABLET | Freq: Two times a day (BID) | ORAL | Status: DC | PRN

## 2017-05-20 MED ORDER — LOSARTAN POTASSIUM 50 MG PO TABS
50.0000 mg | ORAL_TABLET | Freq: Every day | ORAL | Status: DC
  Administered 2017-05-21: 50 mg via ORAL
  Filled 2017-05-20 (×3): qty 1

## 2017-05-20 MED ORDER — HYDRALAZINE HCL 20 MG/ML IJ SOLN: 10.0000 mg | INTRAMUSCULAR | Status: DC | PRN

## 2017-05-20 MED ORDER — FENTANYL CITRATE (PF) 100 MCG/2ML IJ SOLN: 25.0000 ug | INTRAMUSCULAR | Status: DC | PRN

## 2017-05-20 MED ORDER — KCL IN DEXTROSE-NACL 20-5-0.45 MEQ/L-%-% IV SOLN
INTRAVENOUS | Status: DC
  Administered 2017-05-20: 21:00:00 via INTRAVENOUS
  Filled 2017-05-20 (×2): qty 1000

## 2017-05-20 MED ORDER — IOPAMIDOL (ISOVUE-300) INJECTION 61%
INTRAVENOUS | Status: AC
  Administered 2017-05-20: 100 mL via INTRAVENOUS
  Filled 2017-05-20: qty 100

## 2017-05-20 MED ORDER — PIPERACILLIN-TAZOBACTAM 3.375 G IVPB
3.3750 g | Freq: Three times a day (TID) | INTRAVENOUS | Status: DC
  Administered 2017-05-20 – 2017-05-21 (×2): 3.375 g via INTRAVENOUS
  Filled 2017-05-20 (×4): qty 50

## 2017-05-20 MED ORDER — SODIUM CHLORIDE 0.9 % IV BOLUS (SEPSIS)
1000.0000 mL | Freq: Once | INTRAVENOUS | Status: AC
  Administered 2017-05-20: 1000 mL via INTRAVENOUS

## 2017-05-20 MED ORDER — OXYCODONE-ACETAMINOPHEN 5-325 MG PO TABS
1.0000 | ORAL_TABLET | Freq: Once | ORAL | Status: AC
  Administered 2017-05-20: 1 via ORAL
  Filled 2017-05-20: qty 1

## 2017-05-20 MED ORDER — FENTANYL CITRATE (PF) 100 MCG/2ML IJ SOLN
50.0000 ug | Freq: Once | INTRAMUSCULAR | Status: AC
  Administered 2017-05-20: 50 ug via INTRAVENOUS
  Filled 2017-05-20: qty 2

## 2017-05-20 MED ORDER — DIPHENHYDRAMINE HCL 25 MG PO CAPS: 25.0000 mg | ORAL_CAPSULE | Freq: Four times a day (QID) | ORAL | Status: DC | PRN

## 2017-05-20 MED ORDER — METHOCARBAMOL 500 MG PO TABS: 500.0000 mg | ORAL_TABLET | Freq: Four times a day (QID) | ORAL | Status: DC | PRN

## 2017-05-20 MED ORDER — ACETAMINOPHEN 500 MG PO TABS
500.0000 mg | ORAL_TABLET | Freq: Four times a day (QID) | ORAL | Status: DC | PRN
  Administered 2017-05-20: 1000 mg via ORAL
  Filled 2017-05-20: qty 2

## 2017-05-20 MED ORDER — GABAPENTIN 300 MG PO CAPS
300.0000 mg | ORAL_CAPSULE | Freq: Two times a day (BID) | ORAL | Status: DC
  Filled 2017-05-20: qty 1

## 2017-05-20 MED ORDER — SIMVASTATIN 40 MG PO TABS
40.0000 mg | ORAL_TABLET | Freq: Every day | ORAL | Status: DC
  Filled 2017-05-20 (×2): qty 1

## 2017-05-20 NOTE — ED Triage Notes (Signed)
Pt c/o bil inguinal & umbilical hernia sx in Nov 2018, x2 wks ago pt had to have fluid removed from scrotum (procedure here), pt  Reports increase in pain, pt sent by Gross, MD here for eval, per pt bacteria grew from fluid collected, A&O x4

## 2017-05-20 NOTE — ED Notes (Signed)
Patient returned from CT, no complaints at this time

## 2017-05-20 NOTE — ED Notes (Addendum)
Pt family member came up to me to ask about wait time and that pt is stating to have more pain. I have let the nurse know about pt pain .

## 2017-05-20 NOTE — ED Notes (Signed)
Patient is stable and ready to be transport to the floor at this time.  Report was called to 6N RN.  Belongings taken with the patient to the floor.   

## 2017-05-20 NOTE — H&P (Signed)
Logan Chang is an 52 y.o. adult.   Chief Complaint: Right testicle pain and bilateral lower abdominal pain HPI: Logan Chang is status post laparoscopic repair of bilateral inguinal hernias and repair of umbilical hernia by Dr. gross in November of last year.  He developed a postoperative hematoma involving the right side which required drainage by interventional radiology.  It improved for a time but he has had increasing symptoms recently.  Today he had worsening pain and swelling in his right testicle as well as lower abdominal pain.  He spoke with our office and was directed to the emergency department.  Laboratory studies are unremarkable.  His symptoms have been getting worse for a few days.  Past Medical History:  Diagnosis Date  . High cholesterol   . Hypertension     Past Surgical History:  Procedure Laterality Date  . BACK SURGERY    . HERNIA MESH REMOVAL  2016   removal of infected meshhad right inguinal hernia.  Open wound.  Closed August 2016  . INGUINAL HERNIA REPAIR Bilateral 03/04/2017   Procedure: LAPAROSCOPIC BILATERAL INGUINAL HERNIA REPAIR WITH MESH;  Surgeon: Michael Boston, MD;  Location: [facility];  Service: General;  Laterality: Bilateral;  . INGUINAL HERNIA REPAIR  2015   emergent open repair for incarcerated right inguinal hernia  . INGUINAL HERNIA REPAIR Right 2016   open repair with mesh for recurrent right inguinal hernia.  [facility] [place]  03/04/2017   Procedure: INSERTION OF MESH;  Surgeon: Michael Boston, MD;  Location: [facility];  Service: General;;  . REPLACEMENT TOTAL HIP W/  RESURFACING IMPLANTS      No family history on file. Social History:  reports that  has never smoked. he has never used smokeless tobacco. He reports that he drinks alcohol. He reports that he does not use drugs.  Allergies:  Allergies  Allergen Reactions  . Codeine Anaphylaxis and Hives    Swelling of the throat and body breaks out in blisters  . Sulfa  Antibiotics Rash and Other (See Comments)    Breaks out in blisters also     (Not in a hospital admission)  Results for orders placed or performed during the hospital encounter of 05/20/17 (from the past 48 hour(s))  Lipase, blood     Status: None   Collection Time: 05/20/17  1:25 PM  Result Value Ref Range   Lipase 36 11 - 51 U/L  Comprehensive metabolic panel     Status: Abnormal   Collection Time: 05/20/17  1:25 PM  Result Value Ref Range   Sodium 138 135 - 145 mmol/L   Potassium 4.0 3.5 - 5.1 mmol/L   Chloride 105 101 - 111 mmol/L   CO2 21 (L) 22 - 32 mmol/L   Glucose, Bld 98 65 - 99 mg/dL   BUN 10 6 - 20 mg/dL   Creatinine, Ser 0.83 0.61 - 1.24 mg/dL   Calcium 9.4 8.9 - 10.3 mg/dL   Total Protein 7.3 6.5 - 8.1 g/dL   Albumin 4.2 3.5 - 5.0 g/dL   AST 34 15 - 41 U/L   ALT 59 17 - 63 U/L   Alkaline Phosphatase 90 38 - 126 U/L   Total Bilirubin 0.8 0.3 - 1.2 mg/dL   GFR calc non Af Amer >60 >60 mL/min   GFR calc Af Amer >60 >60 mL/min    Comment: (NOTE) The eGFR has been calculated using the CKD EPI equation. This calculation has not  been validated in all clinical situations. eGFR's persistently <60 mL/min signify possible Chronic Kidney Disease.    Anion gap 12 5 - 15  CBC     Status: None   Collection Time: 05/20/17  1:25 PM  Result Value Ref Range   WBC 9.3 4.0 - 10.5 K/uL   RBC 5.30 4.22 - 5.81 MIL/uL   Hemoglobin 16.3 13.0 - 17.0 g/dL   HCT 48.0 39.0 - 52.0 %   MCV 90.6 78.0 - 100.0 fL   MCH 30.8 26.0 - 34.0 pg   MCHC 34.0 30.0 - 36.0 g/dL   RDW 12.8 11.5 - 15.5 %   Platelets 263 150 - 400 K/uL  I-Stat CG4 Lactic Acid, ED     Status: None   Collection Time: 05/20/17  1:50 PM  Result Value Ref Range   Lactic Acid, Venous 1.88 0.5 - 1.9 mmol/L   No results found.  Review of Systems  Constitutional: Negative for chills and fever.  HENT: Negative.   Eyes: Negative.   Respiratory: Negative for cough and shortness of breath.   Cardiovascular: Negative for  chest pain.  Gastrointestinal: Positive for abdominal pain. Negative for nausea and vomiting.  Genitourinary:       Right testicle edema with pain  Musculoskeletal: Negative.   Skin: Negative.   Neurological: Negative.   Endo/Heme/Allergies: Negative.   Psychiatric/Behavioral: Negative.     Blood pressure 121/77, pulse 76, temperature 98.1 F (36.7 C), temperature source Oral, resp. rate 18, height 5' 8"  (1.727 m), weight 90.7 kg (200 lb), SpO2 97 %. Physical Exam  Constitutional: He is oriented to person, place, and time. He appears well-developed and well-nourished.  HENT:  Head: Normocephalic.  Right Ear: External ear normal.  Left Ear: External ear normal.  Nose: Nose normal.  Eyes: EOM are normal. Pupils are equal, round, and reactive to light.  Neck: Neck supple.  Cardiovascular: Normal rate, regular rhythm and normal heart sounds.  Respiratory: Effort normal. No respiratory distress. He has no wheezes. He has no rales.  GI: Soft. He exhibits no distension. There is tenderness. There is no rebound and no guarding.  Mild bilateral lower abdominal tenderness, no obvious mass or hematoma, incisions healed  Genitourinary:  Genitourinary Comments: Right testicle with edema and mild tenderness  Neurological: He is alert and oriented to person, place, and time.  Skin: Skin is warm.     Assessment/Plan Status post laparoscopic bilateral inguinal hernia repair and umbilical hernia repair last November comp located by right sided hematoma.  His symptoms regarding this have worsened.  I will plan to admit him, start IV Zosyn, and obtain a CT scan of the abdomen and pelvis to further evaluate if his fluid collection has reaccumulated.  I will discuss this further with Dr. Johney Maine as well.  Zenovia Jarred, MD 05/20/2017, 7:09 PM

## 2017-05-20 NOTE — ED Provider Notes (Signed)
[facility] EMERGENCY DEPARTMENT Provider Note   CSN: [id number] Arrival date & time: 05/20/17  1234     History   Chief Complaint Chief Complaint  Patient presents with  . Abdominal Pain    post hernia surgery    HPI Logan Chang is a 52 y.o. adult.  Patient is a 52 year old male who presents with abdominal pain.  He has a history of an incarcerated right inguinal hernia status post reduction and laparoscopic repair on November 5.  He was having some increased pain in early January and swelling adjacent to his right testicle and inguinal canal.  He had a CT scan on January 3 which showed a complex fluid collection within the right inguinal canal.  He had an ultrasound-guided aspiration on January 4 which ultimately grew out Corynebacterium.  He was started on doxycycline which he is currently still taking.  He states he was feeling better after that but now has increased fluid collection again.  He has worsening swelling to his testicle and worsening pain across his lower abdomen, particularly on the right side.  He denies any fevers.  He has had some nausea but no vomiting.  He has had decreased appetite.  His wife states that they tried to get into Dr. Clyda Greener office today but were unable to and reportedly was told to come to the emergency room for repeat imaging.  He does have a complicated history in that he had a prior right inguinal hernia repair that resulted in a significant infection with wound VAC.  So he is concerned that that may be recurring.  He did have some diarrhea over the last couple days but thinks it might be related to the antibiotics.      Past Medical History:  Diagnosis Date  . High cholesterol   . Hypertension     Patient Active Problem List   Diagnosis Date Noted  . Groin hematoma, subsequent encounter 05/20/2017  . Left inguinal hernia s/p lap repair 03/04/2017 03/04/2017  . Incarcerated umbilical hernia s/p primary repair 03/04/2017  03/04/2017  . Hypertension   . High cholesterol   . Incarcerated recurrent right inguinal hernia s/p reduction & lap repair 03/04/2017 03/01/2017    Past Surgical History:  Procedure Laterality Date  . BACK SURGERY    . HERNIA MESH REMOVAL  2016   removal of infected meshhad right inguinal hernia.  Open wound.  Closed August 2016  . INGUINAL HERNIA REPAIR Bilateral 03/04/2017   Procedure: LAPAROSCOPIC BILATERAL INGUINAL HERNIA REPAIR WITH MESH;  Surgeon: Michael Boston, MD;  Location: [facility];  Service: General;  Laterality: Bilateral;  . INGUINAL HERNIA REPAIR  2015   emergent open repair for incarcerated right inguinal hernia  . INGUINAL HERNIA REPAIR Right 2016   open repair with mesh for recurrent right inguinal hernia.  [facility]  03/04/2017   Procedure: INSERTION OF MESH;  Surgeon: Michael Boston, MD;  Location: [facility];  Service: General;;  . REPLACEMENT TOTAL HIP W/  RESURFACING IMPLANTS      OB History    No data available       Home Medications    Prior to Admission medications   Medication Sig Start Date End Date Taking? Authorizing Provider  acetaminophen (TYLENOL) 500 MG tablet Take 500-1,000 mg by mouth every 6 (six) hours as needed (for pain).   Yes [provider]  aspirin EC 81 MG tablet Take 81 mg by mouth daily.  06/15/14  Yes [provider]  doxycycline (VIBRAMYCIN) 100 MG capsule Take 100 mg by mouth 2 (two) times daily. 05/02/17  Yes [provider]  gabapentin (NEURONTIN) 300 MG capsule Take 300 mg by mouth 2 (two) times daily. 04/24/17  Yes [provider]  losartan (COZAAR) 50 MG tablet Take 50 mg by mouth daily. 12/15/16  Yes [provider]  naproxen (NAPROSYN) 500 MG tablet Take 1 tablet (500 mg total) every 12 (twelve) hours as needed by mouth for mild pain or moderate pain. 03/04/17  Yes Michael Boston, MD  simvastatin (ZOCOR) 40 MG tablet Take 40 mg by mouth daily.  02/15/17   Yes [provider]  oxyCODONE (OXY IR/ROXICODONE) 5 MG immediate release tablet Take 1-2 tablets (5-10 mg total) every 4 (four) hours as needed by mouth for moderate pain, severe pain or breakthrough pain. Patient not taking: Reported on 05/20/2017 03/04/17   Michael Boston, MD    Family History No family history on file.  Social History Social History   Tobacco Use  . Smoking status: Never Smoker  . Smokeless tobacco: Never Used  Substance Use Topics  . Alcohol use: Yes  . Drug use: No     Allergies   Codeine and Sulfa antibiotics   Review of Systems Review of Systems  Constitutional: Positive for appetite change. Negative for chills, diaphoresis, fatigue and fever.  HENT: Negative for congestion, rhinorrhea and sneezing.   Eyes: Negative.   Respiratory: Negative for cough, chest tightness and shortness of breath.   Cardiovascular: Negative for chest pain and leg swelling.  Gastrointestinal: Positive for abdominal pain, diarrhea and nausea. Negative for blood in stool and vomiting.  Genitourinary: Negative for difficulty urinating, flank pain, frequency and hematuria.  Musculoskeletal: Negative for arthralgias and back pain.  Skin: Negative for rash.  Neurological: Negative for dizziness, speech difficulty, weakness, numbness and headaches.     Physical Exam Updated Vital Signs BP 121/77   Pulse 76   Temp 98.1 F (36.7 C) (Oral)   Resp 18   Ht 5\' 8"  (1.727 m)   Wt 90.7 kg (200 lb)   SpO2 97%   BMI 30.41 kg/m   Physical Exam  Constitutional: He is oriented to person, place, and time. He appears well-developed and well-nourished.  HENT:  Head: Normocephalic and atraumatic.  Eyes: Pupils are equal, round, and reactive to light.  Neck: Normal range of motion. Neck supple.  Cardiovascular: Normal rate, regular rhythm and normal heart sounds.  Pulmonary/Chest: Effort normal and breath sounds normal. No respiratory distress. He has no wheezes. He has no  rales. He exhibits no tenderness.  Abdominal: Soft. Bowel sounds are normal. There is tenderness (Positive tenderness across the lower abdomen, more on the right side.  There is some tenderness and puffiness to the right inguinal canal.  There is swelling and erythema to the scrotum, particularly on the right side.). There is no rebound and no guarding.  Musculoskeletal: Normal range of motion. He exhibits no edema.  Lymphadenopathy:    He has no cervical adenopathy.  Neurological: He is alert and oriented to person, place, and time.  Skin: Skin is warm and dry. No rash noted.  Psychiatric: He has a normal mood and affect.     ED Treatments / Results  Labs (all labs ordered are listed, but only abnormal results are displayed) Labs Reviewed  COMPREHENSIVE METABOLIC PANEL - Abnormal; Notable for the following components:      Result Value   CO2  21 (*)    All other components within normal limits  LIPASE, BLOOD  CBC  URINALYSIS, ROUTINE W REFLEX MICROSCOPIC  I-STAT CG4 LACTIC ACID, ED    EKG  EKG Interpretation None       Radiology No results found.  Procedures Procedures (including critical care time)  Medications Ordered in ED Medications  sodium chloride 0.9 % bolus 1,000 mL (1,000 mLs Intravenous New Bag/Given 05/20/17 1833)  oxyCODONE-acetaminophen (PERCOCET/ROXICET) 5-325 MG per tablet 1 tablet (1 tablet Oral Given 05/20/17 1326)  fentaNYL (SUBLIMAZE) injection 50 mcg (50 mcg Intravenous Given 05/20/17 1833)     Initial Impression / Assessment and Plan / ED Course  I have reviewed the triage vital signs and the nursing notes.  Pertinent labs & imaging results that were available during my care of the patient were reviewed by me and considered in my medical decision making (see chart for details).     17:50 I spoke with Dr. Grandville Silos with general surgery regarding repeat imaging.  Dr. Grandville Silos will come and evaluate the patient and then decide.  Patient has been  seen by Dr. Grandville Silos and will be admitted for IV antibiotics and repeat CT imaging.  Final Clinical Impressions(s) / ED Diagnoses   Final diagnoses:  Right lower quadrant abdominal pain  Groin swelling    ED Discharge Orders    None       Malvin Johns, MD 05/20/17 1927

## 2017-05-21 ENCOUNTER — Other Ambulatory Visit: Payer: Self-pay

## 2017-05-21 ENCOUNTER — Encounter (HOSPITAL_COMMUNITY): Payer: Self-pay | Admitting: Surgery

## 2017-05-21 DIAGNOSIS — T8579XA Infection and inflammatory reaction due to other internal prosthetic devices, implants and grafts, initial encounter: Secondary | ICD-10-CM

## 2017-05-21 DIAGNOSIS — R1031 Right lower quadrant pain: Secondary | ICD-10-CM

## 2017-05-21 DIAGNOSIS — R197 Diarrhea, unspecified: Secondary | ICD-10-CM

## 2017-05-21 DIAGNOSIS — R112 Nausea with vomiting, unspecified: Secondary | ICD-10-CM

## 2017-05-21 DIAGNOSIS — F411 Generalized anxiety disorder: Secondary | ICD-10-CM

## 2017-05-21 HISTORY — DX: Infection and inflammatory reaction due to other internal prosthetic devices, implants and grafts, initial encounter: T85.79XA

## 2017-05-21 LAB — CBC
HCT: 46 % (ref 39.0–52.0)
Hemoglobin: 15.1 g/dL (ref 13.0–17.0)
MCH: 29.9 pg (ref 26.0–34.0)
MCHC: 32.8 g/dL (ref 30.0–36.0)
MCV: 91.1 fL (ref 78.0–100.0)
PLATELETS: 247 10*3/uL (ref 150–400)
RBC: 5.05 MIL/uL (ref 4.22–5.81)
RDW: 12.7 % (ref 11.5–15.5)
WBC: 7.2 10*3/uL (ref 4.0–10.5)

## 2017-05-21 LAB — SURGICAL PCR SCREEN
MRSA, PCR: NEGATIVE
Staphylococcus aureus: NEGATIVE

## 2017-05-21 MED ORDER — ACETAMINOPHEN 500 MG PO TABS
1000.0000 mg | ORAL_TABLET | Freq: Three times a day (TID) | ORAL | Status: DC
  Administered 2017-05-21: 1000 mg via ORAL
  Filled 2017-05-21: qty 2

## 2017-05-21 MED ORDER — SODIUM CHLORIDE 0.9% FLUSH
3.0000 mL | Freq: Two times a day (BID) | INTRAVENOUS | Status: DC
  Administered 2017-05-21: 3 mL via INTRAVENOUS

## 2017-05-21 MED ORDER — GABAPENTIN 300 MG PO CAPS: 600.0000 mg | ORAL_CAPSULE | Freq: Three times a day (TID) | ORAL | 2 refills | Status: AC

## 2017-05-21 MED ORDER — GABAPENTIN 300 MG PO CAPS
600.0000 mg | ORAL_CAPSULE | Freq: Three times a day (TID) | ORAL | Status: DC
  Administered 2017-05-21: 600 mg via ORAL
  Filled 2017-05-21: qty 2

## 2017-05-21 MED ORDER — ASPIRIN EC 81 MG PO TBEC
81.0000 mg | DELAYED_RELEASE_TABLET | Freq: Every day | ORAL | Status: DC
  Administered 2017-05-21: 81 mg via ORAL
  Filled 2017-05-21: qty 1

## 2017-05-21 MED ORDER — LACTATED RINGERS IV BOLUS (SEPSIS)
1000.0000 mL | Freq: Once | INTRAVENOUS | Status: AC
  Administered 2017-05-21: 1000 mL via INTRAVENOUS

## 2017-05-21 MED ORDER — LACTATED RINGERS IV BOLUS (SEPSIS): 1000.0000 mL | Freq: Three times a day (TID) | INTRAVENOUS | Status: DC | PRN

## 2017-05-21 MED ORDER — OXYCODONE HCL 5 MG PO TABS: 5.0000 mg | ORAL_TABLET | Freq: Four times a day (QID) | ORAL | 0 refills | Status: AC | PRN

## 2017-05-21 MED ORDER — OXYCODONE HCL 5 MG PO TABS
5.0000 mg | ORAL_TABLET | ORAL | Status: DC | PRN
  Administered 2017-05-21: 10 mg via ORAL
  Filled 2017-05-21: qty 2

## 2017-05-21 MED ORDER — SODIUM CHLORIDE 0.9% FLUSH: 3.0000 mL | INTRAVENOUS | Status: DC | PRN

## 2017-05-21 MED ORDER — SODIUM CHLORIDE 0.9 % IV SOLN: 250.0000 mL | INTRAVENOUS | Status: DC | PRN

## 2017-05-21 NOTE — Progress Notes (Signed)
Logan Chang to be D/C'd  per MD order. Discussed with the patient and all questions fully answered.  VSS, Skin clean, dry and intact without evidence of skin break down, no evidence of skin tears noted.  IV catheter discontinued intact. Site without signs and symptoms of complications. Dressing and pressure applied.  An After Visit Summary was printed and given to the patient. Patient received prescription.  D/c education completed with patient/family including follow up instructions, medication list, d/c activities limitations if indicated, with other d/c instructions as indicated by MD - patient able to verbalize understanding, all questions fully answered.   Patient instructed to return to ED, call 911, or call MD for any changes in condition.   Patient to be escorted via [place], and D/C home via private auto.

## 2017-05-21 NOTE — Discharge Instructions (Signed)
Suspect you had an episode of viral gastroenteritis that is better with IV fluids.  Consider Metamucil fiber bowel regimen to help your bowels normalize  Control your groin pain better.  Tylenol extra strength around the clock= 1g three times a day.  Double gabapentin to 600 mg twice a day.  Can take gabapentin up to four times a day.  Continue heat and groin stretches.  Try hip stretching exercises noted below use oxycodone as needed.  Follow-up at my office in 2 weeks to see if things are improving.  While you do have a persistent fluid collection in your right scrotum, it is more narrow and slightly smaller.  No evidence of new or worsening infection.  Complete doxycycline oral antibiotic course for the full 6 weeks.  The chronic fluid collection should gradually absorb and get smaller in the next 6 months.  Managing Pain  ######################################################################   CONTROL PAIN Control pain so that you can walk, sleep, tolerate sneezing/coughing, go up/down stairs.  (Good pain control is not pain free only when lying still, unable to move)  WALK Walk an hour a day.  Control your pain to do that.   HAVE A BOWEL MOVEMENT DAILY Keep your bowels regular to avoid problems.  OK to try a laxative to override constipation.  OK to use an antidairrheal to slow down diarrhea.  Call if not better after 2 tries  CALL IF YOU HAVE PROBLEMS/CONCERNS Call if you are still struggling despite following these instructions. Call if you have concerns not answered by these instructions  ######################################################################   Pain after surgery or related to activity is often due to strain/injury to muscle, tendon, nerves and/or incisions.  This pain is usually short-term and will improve in a few months.   Many people find it helpful to do the following things TOGETHER to help speed the process of healing and to get back to regular activity  more quickly:  1. Avoid heavy physical activity at first a. No lifting greater than 20 pounds at first, then increase to lifting as tolerated over the next few weeks b. Do not push through the pain.  Listen to your body and avoid positions and maneuvers than reproduce the pain.  Wait a few days before trying something more intense c. Walking is okay as tolerated, but go slowly and stop when getting sore.  If you can walk 30 minutes without stopping or pain, you can try more intense activity (running, jogging, aerobics, cycling, swimming, treadmill, sex, sports, weightlifting, etc ) d. Remember: If it hurts to do it, then dont do it!  2. Take Acetaminophen Anti-inflammatory medication i. Acetaminophen 500mg  tabs (Tylenol) 1-2 pills with every meal and just before bedtime (avoid if you have liver problems) ii. Take with food/snack around the clock for 1-2 weeks iii. This helps the muscle and nerve tissues become less irritable and calm down faster  3. Use a Heating pad or Ice/Cold Pack a. 4-6 times a day b. May use warm bath/hottub  or showers  4. Try Gentle Massage and/or Stretching  a. at the area of pain many times a day b. stop if you feel pain - do not overdo it  Try these steps together to help you body heal faster and avoid making things get worse.  Doing just one of these things may not be enough.    If you are not getting better after two weeks or are noticing you are getting worse, contact our office for further advice; we  may need to re-evaluate you & see what other things we can do to help.   Hip Exercises Ask your health care provider which exercises are safe for you. Do exercises exactly as told by your health care provider and adjust them as directed. It is normal to feel mild stretching, pulling, tightness, or discomfort as you do these exercises, but you should stop right away if you feel sudden pain or your pain gets worse.Do not begin these exercises until told by your  health care provider. STRETCHING AND RANGE OF MOTION EXERCISES These exercises warm up your muscles and joints and improve the movement and flexibility of your hip. These exercises also help to relieve pain, numbness, and tingling. Exercise A: Hamstrings, Supine  1. Lie on your back. 2. Loop a belt or towel over the ball of your left / rightfoot. The ball of your foot is on the walking surface, right under your toes. 3. Straighten your left / rightknee and slowly pull on the belt to raise your leg. ? Do not let your left / right knee bend while you do this. ? Keep your other leg flat on the floor. ? Raise the left / right leg until you feel a gentle stretch behind your left / right knee or thigh. 4. Hold this position for __________ seconds. 5. Slowly return your leg to the starting position. Repeat __________ times. Complete this stretch __________ times a day. Exercise B: Hip Rotators  1. Lie on your back on a firm surface. 2. Hold your left / right knee with your left / right hand. Hold your ankle with your other hand. 3. Gently pull your left / right knee and rotate your lower leg toward your other shoulder. ? Pull until you feel a stretch in your buttocks. ? Keep your hips and shoulders firmly planted while you do this stretch. 4. Hold this position for __________ seconds. Repeat __________ times. Complete this stretch __________ times a day. Exercise C: V-Sit (Hamstrings and Adductors)  1. Sit on the floor with your legs extended in a large V shape. Keep your knees straight during this exercise. 2. Start with your head and chest upright, then bend at your waist to reach for your left foot (position A). You should feel a stretch in your right inner thigh. 3. Hold this position for __________ seconds. Then slowly return to the upright position. 4. Bend at your waist to reach forward (position B). You should feel a stretch behind both of your thighs and knees. 5. Hold this  position for __________ seconds. Then slowly return to the upright position. 6. Bend at your waist to reach for your right foot (position C). You should feel a stretch in your left inner thigh. 7. Hold this position for __________ seconds. Then slowly return to the upright position. Repeat __________ times. Complete this stretch __________ times a day. Exercise D: Lunge (Hip Flexors)  1. Place your left / right knee on the floor and bend your other knee so that is directly over your ankle. You should be half-kneeling. 2. Keep good posture with your head over your shoulders. 3. Tighten your buttocks to point your tailbone downward. This helps your back to keep from arching too much. 4. You should feel a gentle stretch in the front of your left / right thigh and hip. If you do not feel any resistance, slightly slide your other foot forward and then slowly lunge forward so your knee once again lines up over your  ankle. 5. Make sure your tailbone continues to point downward. 6. Hold this position for __________ seconds. Repeat __________ times. Complete this stretch __________ times a day. STRENGTHENING EXERCISES These exercises build strength and endurance in your hip. Endurance is the ability to use your muscles for a long time, even after they get tired. Exercise E: Bridge (Hip Extensors)  1. Lie on your back on a firm surface with your knees bent and your feet flat on the floor. 2. Tighten your buttocks muscles and lift your bottom off the floor until the trunk of your body is level with your thighs. ? Do not arch your back. ? You should feel the muscles working in your buttocks and the back of your thighs. If you do not feel these muscles, slide your feet 1-2 inches (2.5-5 cm) farther away from your buttocks. 3. Hold this position for __________ seconds. 4. Slowly lower your hips to the starting position. 5. Let your muscles relax completely between repetitions. 6. If this exercise is too  easy, try doing it with your arms crossed over your chest. Repeat __________ times. Complete this exercise __________ times a day. Exercise F: Straight Leg Raises - Hip Abductors  1. Lie on your side with your left / right leg in the top position. Lie so your head, shoulder, knee, and hip line up with each other. You may bend your bottom knee to help you balance. 2. Roll your hips slightly forward, so your hips are stacked directly over each other and your left / right knee is facing forward. 3. Leading with your heel, lift your top leg 4-6 inches (10-15 cm). You should feel the muscles in your outer hip lifting. ? Do not let your foot drift forward. ? Do not let your knee roll toward the ceiling. 4. Hold this position for __________ seconds. 5. Slowly return to the starting position. 6. Let your muscles relax completely between repetitions. Repeat __________ times. Complete this exercise __________ times a day. Exercise G: Straight Leg Raises - Hip Adductors  1. Lie on your side with your left / right leg in the bottom position. Lie so your head, shoulder, knee, and hip line up. You may place your upper foot in front to help you balance. 2. Roll your hips slightly forward, so your hips are stacked directly over each other and your left / right knee is facing forward. 3. Tense the muscles in your inner thigh and lift your bottom leg 4-6 inches (10-15 cm). 4. Hold this position for __________ seconds. 5. Slowly return to the starting position. 6. Let your muscles relax completely between repetitions. Repeat __________ times. Complete this exercise __________ times a day. Exercise H: Straight Leg Raises - Quadriceps  1. Lie on your back with your left / right leg extended and your other knee bent. 2. Tense the muscles in the front of your left / right thigh. When you do this, you should see your kneecap slide up or see increased dimpling just above your knee. 3. Tighten these muscles even  more and raise your leg 4-6 inches (10-15 cm) off the floor. 4. Hold this position for __________ seconds. 5. Keep these muscles tense as you lower your leg. 6. Relax the muscles slowly and completely between repetitions. Repeat __________ times. Complete this exercise __________ times a day. Exercise I: Hip Abductors, Standing 1. Tie one end of a rubber exercise band or tubing to a secure surface, such as a table or pole. 2. Loop the  other end of the band or tubing around your left / right ankle. 3. Keeping your ankle with the band or tubing directly opposite of the secured end, step away until there is tension in the tubing or band. Hold onto a chair as needed for balance. 4. Lift your left / right leg out to your side. While you do this: ? Keep your back upright. ? Keep your shoulders over your hips. ? Keep your toes pointing forward. ? Make sure to use your hip muscles to lift your leg. Do not "throw" your leg or tip your body to lift your leg. 5. Hold this position for __________ seconds. 6. Slowly return to the starting position. Repeat __________ times. Complete this exercise __________ times a day. Exercise J: Squats (Quadriceps) 1. Stand in a door frame so your feet and knees are in line with the frame. You may place your hands on the frame for balance. 2. Slowly bend your knees and lower your hips like you are going to sit in a chair. ? Keep your lower legs in a straight-up-and-down position. ? Do not let your hips go lower than your knees. ? Do not bend your knees lower than told by your health care provider. ? If your hip pain increases, do not bend as low. 3. Hold this position for ___________ seconds. 4. Slowly push with your legs to return to standing. Do not use your hands to pull yourself to standing. Repeat __________ times. Complete this exercise __________ times a day. This information is not intended to replace advice given to you by your health care provider. Make  sure you discuss any questions you have with your health care provider. Document Released: 05/04/2005 Document Revised: 01/09/2016 Document Reviewed: 04/11/2015 [employer]vier Interactive Patient Education  [employer].

## 2017-05-21 NOTE — Progress Notes (Signed)
[facility]  [address] [place], [place] [address] Phone: [phone]  FAX: [phone]      Logan Chang [id number]  CARE TEAM:  PCP: Manfred Shirts, PA  Outpatient Care Team: Patient Care Team: Manfred Shirts, [place] as PCP - General (General Practice) Michael Boston, MD as Consulting Physician (General Surgery)  Inpatient Treatment Team: Treatment Team: Attending Provider: Michael Boston, MD; Registered Nurse: Kennith Center, RN   Problem List:   Principal Problem:   Nausea, vomiting and diarrhea Active Problems:   Inguinodynia, right   Incarcerated recurrent right inguinal hernia s/p reduction & lap repair 03/04/2017   Essential hypertension   Left inguinal hernia s/p lap repair 03/04/2017   Incarcerated umbilical hernia s/p primary repair 03/04/2017   Groin hematoma, subsequent encounter   Anxiety state      * No surgery found *     Assessment  Nausea near vomiting and diarrhea improved with hydration and bowel rest.  Chronic right scrotal seroma significant but smaller without any evidence of worsening infection.  Plan:  Nausea and near vomiting with diarrhea resolved with IV fluids.  Most likely episode of gastroenteritis or food poisoning.  Improved though with bowel rest and IV fluids..  Restart diet and bowel regimen and see if this improves.  I suspect his crampiness in the retching triggered some groin pain that scared him.  Fiber bowel regimen to help normalize his bowels.  -Persistent right scrotal hematoma/seroma.  Smaller since aspiration & antibiotics.  12 x 6 x 5.4 cm and decreased to 11 x 5 x 3 cm.  Question of diphtheroids and first aspiration most likely skin contaminant.  Nonetheless, continue antibiotics for 6 weeks.  Would resume doxycycline since he has no fever or leukocytosis or evidence of infection.  Hold off on repeat aspiration to decrease potential risk of contamination with mesh.   Should have proof of some infection irritation, repeat aspiration and switch to IV antibiotics with a PICC line for 6 weeks.  To do that before even considering mesh removal.  There is no fluid up in the abdomen or surrounding the mesh.  He has no fevers.  No loose leukocytosis.  There is no erythema.  There is no severe scrotal pain or warmth or tenderness to imply infection.  Persistent inguinal hernia with numerous groin surgeries in the setting of anxiety and chronic seroma. Improve pain control.  He been trying to do this essentially with occasional Tylenol and gabapentin twice a day.  There is room to improve.  Double gabapentin to 600 mg.  Continue Tylenol around-the-clock.  I discussed the possibility of going back to nonsteroidals.  However he is on losartan and his aunt had serious kidney problems on Aleve, so he would like to hold off on NSAIDs.  Occasional narcotics as needed for breakthrough pain.  I would give this time.  I suspect he is developing some chronic right groin pain, this being his fourth surgery in that region.  Hopefully with gabapentin and other interventions in time it will improve.  He may benefit from nerve blocks with Kenalog.  However, I wait until at least 3 months from surgery which would place in until February.  May do combination all repeat aspiration and injection.  Try to hold off though.  Final option would be repeat surgery with possible triple neurectomy if for any inguinodynia.  However any repeat operation is risky with him.  They would like to  hold off on repeat surgery.  Better just anxious.  -VTE prophylaxis- SCDs, etc -mobilize as tolerated to help recovery   40 minutes spent in review, evaluation, examination, counseling, and coordination of care.  More than 50% of that time was spent in counseling.  [name], M.D., F.A.C.S. Gastrointestinal and Minimally Invasive Surgery [facility] [address] [place], [place] [address] [phone] Main / Paging   05/21/2017    Subjective: (Chief complaint)  Feeling better this morning.  Back to his chronic right groin pain and sensitivity.  No more nausea.  Abdominal crampiness is gone down.  Was rather severe last night but reduced.  Urinating fine.  Not lightheaded.  Has not tried anything by mouth yet.  Objective:  Vital signs:  Vitals:   05/20/17 2000 05/20/17 2015 05/20/17 2157 05/21/17 0623  BP: 128/80 125/86 140/83 107/66  Pulse: 63 66  68  Resp: _0 Temp:   97.7 F (36.5 C) 98 F (36.7 C)  TempSrc:   Oral Oral  SpO2: 98% 96% 97% 97%  Weight:   90.7 kg (200 lb)   Height:        Last BM Date: 05/20/17  Intake/Output   Yesterday:  01/21 0701 - 01/22 0700 In: 1868.8 [P.O.:240; I.V.:578.8; IV Piggyback:1050] Out: 475 [Urine:475] This shift:  No intake/output data recorded.  Bowel function:  Flatus: YES  BM:  YES  Drain: (No drain)   Physical Exam:  General: Pt awake/alert/oriented x4 in no acute distress Eyes: PERRL, normal EOM.  Sclera clear.  No icterus Neuro: CN II-XII intact w/o focal sensory/motor deficits. Lymph: No head/neck/groin lymphadenopathy Psych:  No delerium/psychosis/paranoia HENT: Normocephalic, Mucus membranes moist.  No thrush Neck: Supple, No tracheal deviation Chest: No chest wall pain w good excursion CV:  Pulses intact.  Regular rhythm MS: Normal AROM mjr joints.  No obvious deformity Abdomen: Soft.  Nondistended.  Nontender.  No evidence of peritonitis.  No recurrent umbilical hernia.  Fullness in right groin going down to scrotum.  Soft.  Sensitivity especially at lateral right groin.  His baseline.  No erythema.  No rubor.  No color.  Scrotum slightly larger on the right side but again improved compared to preoperatively.  Nearly symmetrical.  Normal cremasteric reflex.  No cellulitis or redness.  Ext:  No deformity.  No mjr edema.  No cyanosis Skin: No  petechiae / purpura  Results:   Labs: Results for orders placed or performed during the hospital encounter of 05/20/17 (from the past 48 hour(s))  Lipase, blood     Status: None   Collection Time: 05/20/17  1:25 PM  Result Value Ref Range   Lipase 36 11 - 51 U/L  Comprehensive metabolic panel     Status: Abnormal   Collection Time: 05/20/17  1:25 PM  Result Value Ref Range   Sodium 138 135 - 145 mmol/L   Potassium 4.0 3.5 - 5.1 mmol/L   Chloride 105 101 - 111 mmol/L   CO2 21 (L) 22 - 32 mmol/L   Glucose, Bld 98 65 - 99 mg/dL   BUN 10 6 - 20 mg/dL   Creatinine, Ser 0.83 0.61 - 1.24 mg/dL   Calcium 9.4 8.9 - 10.3 mg/dL   Total Protein 7.3 6.5 - 8.1 g/dL   Albumin 4.2 3.5 - 5.0 g/dL   AST 34 15 - 41 U/L   ALT 59 17 - 63 U/L   Alkaline Phosphatase 90 38 -  126 U/L   Total Bilirubin 0.8 0.3 - 1.2 mg/dL   GFR calc non Af Amer >60 >60 mL/min   GFR calc Af Amer >60 >60 mL/min    Comment: (NOTE) The eGFR has been calculated using the CKD EPI equation. This calculation has not been validated in all clinical situations. eGFR's persistently <60 mL/min signify possible Chronic Kidney Disease.    Anion gap 12 5 - 15  CBC     Status: None   Collection Time: 05/20/17  1:25 PM  Result Value Ref Range   WBC 9.3 4.0 - 10.5 K/uL   RBC 5.30 4.22 - 5.81 MIL/uL   Hemoglobin 16.3 13.0 - 17.0 g/dL   HCT 48.0 39.0 - 52.0 %   MCV 90.6 78.0 - 100.0 fL   MCH 30.8 26.0 - 34.0 pg   MCHC 34.0 30.0 - 36.0 g/dL   RDW 12.8 11.5 - 15.5 %   Platelets 263 150 - 400 K/uL  I-Stat CG4 Lactic Acid, ED     Status: None   Collection Time: 05/20/17  1:50 PM  Result Value Ref Range   Lactic Acid, Venous 1.88 0.5 - 1.9 mmol/L  Protime-INR     Status: None   Collection Time: 05/20/17 10:30 PM  Result Value Ref Range   Prothrombin Time 13.3 11.4 - 15.2 seconds   INR 1.02   Surgical pcr screen     Status: None   Collection Time: 05/20/17 11:20 PM  Result Value Ref Range   MRSA, PCR NEGATIVE NEGATIVE    Staphylococcus aureus NEGATIVE NEGATIVE    Comment: (NOTE) The Xpert SA Assay (FDA approved for NASAL specimens in patients 18 years of age and older), is one component of a comprehensive surveillance program. It is not intended to diagnose infection nor to guide or monitor treatment.   Urinalysis, Routine w reflex microscopic     Status: Abnormal   Collection Time: 05/20/17 11:22 PM  Result Value Ref Range   Color, Urine YELLOW YELLOW   APPearance CLEAR CLEAR   Specific Gravity, Urine 1.044 (H) 1.005 - 1.030   pH 5.0 5.0 - 8.0   Glucose, UA NEGATIVE NEGATIVE mg/dL   Hgb urine dipstick NEGATIVE NEGATIVE   Bilirubin Urine NEGATIVE NEGATIVE   Ketones, ur NEGATIVE NEGATIVE mg/dL   Protein, ur NEGATIVE NEGATIVE mg/dL   Nitrite NEGATIVE NEGATIVE   Leukocytes, UA NEGATIVE NEGATIVE    Imaging / Studies: Ct Abdomen Pelvis W Contrast  Result Date: 05/20/2017 CLINICAL DATA:  52 year old male presents with abdominal pain. History of incarcerated right inguinal hernia status post reduction and laparoscopic repair on November 5. Patient was having increased pain in early January and swelling. Prior comparison CT revealed a complex fluid collection within the right inguinal canal. Patient believes the fluid has reaccumulated. EXAM: CT ABDOMEN AND PELVIS WITH CONTRAST TECHNIQUE: Multidetector CT imaging of the abdomen and pelvis was performed using the standard protocol following bolus administration of intravenous contrast. CONTRAST:  151m ISOVUE-300 IOPAMIDOL (ISOVUE-300) INJECTION 61% COMPARISON:  05/02/2017 FINDINGS: Lower chest: Normal size heart. No significant pericardial effusion. Scarring in the left lower lobe versus atelectasis. No effusion or pneumothorax. Hepatobiliary: Hepatic steatosis. No biliary dilatation. Normal gallbladder without stones. Pancreas: Normal Spleen: Normal Adrenals/Urinary Tract: Normal bilateral adrenal glands. Punctate bilateral renal calculi without obstruction. No  enhancing renal masses. Bilateral too small to characterize hypodensities compatible statistically with cysts. No significant change. Nondistended urinary bladder without focal mural thickening or calculus. Stomach/Bowel: Stomach is within normal limits. Appendix  appears normal. No evidence of bowel wall thickening, distention, or inflammatory changes. Vascular/Lymphatic: Aortoiliac and branch vessel atherosclerosis. No adenopathy. Reproductive: Normal prostate. Other: Mesh repair of inguinal hernias. Musculoskeletal: Redemonstration of fluid within the right included inguinal canal, at its greatest measuring 3 x 5.7 cm by at least 11 cm craniocaudad. Vertebral augmentation at L1. Left hip arthroplasty without complication. IMPRESSION: 1. Reaccumulation of complex fluid within the right inguinal canal extending presumably into the scrotum which is excluded on this study. The fluid extends at least 11 cm craniocaudad by 3 x 5.7 cm. 2. Bilateral nephrolithiasis without obstructive uropathy. 3. Aortoiliac atherosclerosis. 4. Hepatic steatosis. Electronically Signed   By: Ashley Royalty M.D.   On: 05/20/2017 21:04    Medications / Allergies: per chart  Antibiotics: Anti-infectives (From admission, onward)   Start     Dose/Rate Route Frequency Ordered Stop   05/20/17 2200  piperacillin-tazobactam (ZOSYN) IVPB 3.375 g     3.375 g 12.5 mL/hr over 240 Minutes Intravenous Every 8 hours 05/20/17 2005          Note: Portions of this report may have been transcribed using voice recognition software. Every effort was made to ensure accuracy; however, inadvertent computerized transcription errors may be present.   Any transcriptional errors that result from this process are unintentional.     [name], M.D., F.A.C.S. Gastrointestinal and Minimally Invasive Surgery [facility] [address] [place], [place] [address] [phone] Main / Paging   05/21/2017

## 2017-05-22 NOTE — Discharge Summary (Signed)
Physician Discharge Summary  Patient ID: Logan Chang MRN: [id number] DOB/AGE: [birth year]  52 y.o.  Admit date: 05/20/2017 Discharge date: 05/21/2017  Patient Care Team: Manfred Shirts, PA as PCP - General (General Practice) Michael Boston, MD as Consulting Physician (General Surgery)  Discharge Diagnoses:  Principal Problem:   Nausea, vomiting and diarrhea Active Problems:   Inguinodynia, right   Incarcerated recurrent right inguinal hernia s/p reduction & lap repair 03/04/2017   Essential hypertension   Left inguinal hernia s/p lap repair 03/04/2017   Incarcerated umbilical hernia s/p primary repair 03/04/2017   Groin hematoma, subsequent encounter   Anxiety state      * No surgery found *  POST-OPERATIVE DIAGNOSIS:   * No surgery found *  SURGERY:  * No surgery found *    SURGEON:    * Surgery not found *  Consults: None  Hospital Course:   Patient with history of recurrent hernias and prior mesh infection 2015 2016.  Had recurrent incarcerated hernia that required decompression and bowel rest.  Had repair done 3 days later.  Has had chronic right groin scrotal hematoma/seroma.  Question of infection on oral doxycycline but resolving with aspiration antibiotics.  Patient had episode of severe crampy abdominal pain and diarrhea.  Intensified his groin pain.  Worried about infection.  Came to emergency room.  Admitted.  He was hydrated.  Improved rapidly.  Diet restarted.  He had no fevers.  No leukocytosis.  The chronic right scrotal seroma/hematoma is smaller.  No evidence of infection by exam or other workup.  By the time of discharge, the patient was walking well the hallways, eating food, having flatus.  Pain was well-controlled on an oral medications.  Based on meeting discharge criteria and continuing to recover, I felt it was safe for the patient to be discharged from the hospital to further recover with close followup. Postoperative recommendations were discussed in  detail.  They are written as well.  Discharged Condition: good  Disposition:  Follow-up Information    Michael Boston, MD. Schedule an appointment as soon as possible for a visit in 2 week(s).   Specialty:  General Surgery Contact information: [address] [place] [address] [phone]           01-Home or Self Care  Discharge Instructions    Call MD for:   Complete by:  As directed    FEVER > 101.5 F  (temperatures < 101.5 F are not significant)   Call MD for:  extreme fatigue   Complete by:  As directed    Call MD for:  persistant dizziness or light-headedness   Complete by:  As directed    Call MD for:  persistant nausea and vomiting   Complete by:  As directed    Call MD for:  redness, tenderness, or signs of infection (pain, swelling, redness, odor or green/yellow discharge around incision site)   Complete by:  As directed    Call MD for:  severe uncontrolled pain   Complete by:  As directed    Diet - low sodium heart healthy   Complete by:  As directed    Follow a light diet the first few days at home.   Start with a bland diet such as soups, liquids, starchy foods, low fat foods, etc.   If you feel full, bloated, or constipated, stay on a full liquid or pureed/blenderized diet for a few days until you feel better and no longer  constipated. Be sure to drink plenty of fluids every day to avoid getting dehydrated (feeling dizzy, not urinating, etc.). Gradually add a fiber supplement to your diet   Discharge instructions   Complete by:  As directed    See Discharge Instructions If you are not getting better after two weeks or are noticing you are getting worse, contact our office [phone] for further advice.  We may need to adjust your medications, re-evaluate you in the office, send you to the emergency room, or see what other things we can do to help. The clinic staff is available to answer your questions during regular business hours  (8:30am-5pm).  Please don't hesitate to call and ask to speak to one of our nurses for clinical concerns.    A surgeon from [facility] is always on call at the hospitals 24 hours/day If you have a medical emergency, go to the nearest emergency room or call 911.   Driving Restrictions   Complete by:  As directed    You may drive when you are no longer taking narcotic prescription pain medication, you can comfortably wear a seatbelt, and you can safely make sudden turns/stops to protect yourself without hesitating due to pain.   Increase activity slowly   Complete by:  As directed    Start light daily activities --- self-care, walking, climbing stairs- beginning the day after surgery.  Gradually increase activities as tolerated.  Control your pain to be active.  Stop when you are tired.  Ideally, walk several times a day, eventually an hour a day.   Most people are back to most day-to-day activities in a few weeks.  It takes 4-8 weeks to get back to unrestricted, intense activity. If you can walk 30 minutes without difficulty, it is safe to try more intense activity such as jogging, treadmill, bicycling, low-impact aerobics, swimming, etc. Save the most intensive and strenuous activity for last (Usually 4-8 weeks after surgery) such as sit-ups, heavy lifting, contact sports, etc.  Refrain from any intense heavy lifting or straining until you are off narcotics for pain control.  You will have off days, but things should improve week-by-week. DO NOT PUSH THROUGH PAIN.  Let pain be your guide: If it hurts to do something, don't do it.  Pain is your body warning you to avoid that activity for another week until the pain goes down.   Lifting restrictions   Complete by:  As directed    If you can walk 30 minutes without difficulty, it is safe to try more intense activity such as jogging, treadmill, bicycling, low-impact aerobics, swimming, etc. Save the most intensive and strenuous activity for  last (Usually 4-8 weeks after surgery) such as sit-ups, heavy lifting, contact sports, etc.  Refrain from any intense heavy lifting or straining until you are off narcotics for pain control.  You will have off days, but things should improve week-by-week. DO NOT PUSH THROUGH PAIN.  Let pain be your guide: If it hurts to do something, don't do it.  Pain is your body warning you to avoid that activity for another week until the pain goes down.   May walk up steps   Complete by:  As directed    Sexual Activity Restrictions   Complete by:  As directed    You may have sexual intercourse when it is comfortable. If it hurts to do something, stop.      Allergies as of 05/21/2017      Reactions  Codeine Anaphylaxis, Hives   Swelling of the throat and body breaks out in blisters   Sulfa Antibiotics Rash, Other (See Comments)   Breaks out in blisters also      Medication List    TAKE these medications   acetaminophen 500 MG tablet Commonly known as:  TYLENOL Take 500-1,000 mg by mouth every 6 (six) hours as needed (for pain).   aspirin EC 81 MG tablet Take 81 mg by mouth daily.   doxycycline 100 MG capsule Commonly known as:  VIBRAMYCIN Take 100 mg by mouth 2 (two) times daily.   gabapentin 300 MG capsule Commonly known as:  NEURONTIN Take 2 capsules (600 mg total) by mouth 3 (three) times daily. What changed:    how much to take  when to take this   losartan 50 MG tablet Commonly known as:  COZAAR Take 50 mg by mouth daily.   naproxen 500 MG tablet Commonly known as:  NAPROSYN Take 1 tablet (500 mg total) every 12 (twelve) hours as needed by mouth for mild pain or moderate pain.   oxyCODONE 5 MG immediate release tablet Commonly known as:  Oxy IR/ROXICODONE Take 1-2 tablets (5-10 mg total) by mouth every 6 (six) hours as needed for breakthrough pain. What changed:    when to take this  reasons to take this   simvastatin 40 MG tablet Commonly known as:  ZOCOR Take 40  mg by mouth daily.       Significant Diagnostic Studies:  Results for orders placed or performed during the hospital encounter of 05/20/17 (from the past 72 hour(s))  Lipase, blood     Status: None   Collection Time: 05/20/17  1:25 PM  Result Value Ref Range   Lipase 36 11 - 51 U/L  Comprehensive metabolic panel     Status: Abnormal   Collection Time: 05/20/17  1:25 PM  Result Value Ref Range   Sodium 138 135 - 145 mmol/L   Potassium 4.0 3.5 - 5.1 mmol/L   Chloride 105 101 - 111 mmol/L   CO2 21 (L) 22 - 32 mmol/L   Glucose, Bld 98 65 - 99 mg/dL   BUN 10 6 - 20 mg/dL   Creatinine, Ser 0.83 0.61 - 1.24 mg/dL   Calcium 9.4 8.9 - 10.3 mg/dL   Total Protein 7.3 6.5 - 8.1 g/dL   Albumin 4.2 3.5 - 5.0 g/dL   AST 34 15 - 41 U/L   ALT 59 17 - 63 U/L   Alkaline Phosphatase 90 38 - 126 U/L   Total Bilirubin 0.8 0.3 - 1.2 mg/dL   GFR calc non Af Amer >60 >60 mL/min   GFR calc Af Amer >60 >60 mL/min    Comment: (NOTE) The eGFR has been calculated using the CKD EPI equation. This calculation has not been validated in all clinical situations. eGFR's persistently <60 mL/min signify possible Chronic Kidney Disease.    Anion gap 12 5 - 15  CBC     Status: None   Collection Time: 05/20/17  1:25 PM  Result Value Ref Range   WBC 9.3 4.0 - 10.5 K/uL   RBC 5.30 4.22 - 5.81 MIL/uL   Hemoglobin 16.3 13.0 - 17.0 g/dL   HCT 48.0 39.0 - 52.0 %   MCV 90.6 78.0 - 100.0 fL   MCH 30.8 26.0 - 34.0 pg   MCHC 34.0 30.0 - 36.0 g/dL   RDW 12.8 11.5 - 15.5 %   Platelets 263 150 - 400  K/uL  I-Stat CG4 Lactic Acid, ED     Status: None   Collection Time: 05/20/17  1:50 PM  Result Value Ref Range   Lactic Acid, Venous 1.88 0.5 - 1.9 mmol/L  Protime-INR     Status: None   Collection Time: 05/20/17 10:30 PM  Result Value Ref Range   Prothrombin Time 13.3 11.4 - 15.2 seconds   INR 1.02   Surgical pcr screen     Status: None   Collection Time: 05/20/17 11:20 PM  Result Value Ref Range   MRSA, PCR  NEGATIVE NEGATIVE   Staphylococcus aureus NEGATIVE NEGATIVE    Comment: (NOTE) The Xpert SA Assay (FDA approved for NASAL specimens in patients 2 years of age and older), is one component of a comprehensive surveillance program. It is not intended to diagnose infection nor to guide or monitor treatment.   Urinalysis, Routine w reflex microscopic     Status: Abnormal   Collection Time: 05/20/17 11:22 PM  Result Value Ref Range   Color, Urine YELLOW YELLOW   APPearance CLEAR CLEAR   Specific Gravity, Urine 1.044 (H) 1.005 - 1.030   pH 5.0 5.0 - 8.0   Glucose, UA NEGATIVE NEGATIVE mg/dL   Hgb urine dipstick NEGATIVE NEGATIVE   Bilirubin Urine NEGATIVE NEGATIVE   Ketones, ur NEGATIVE NEGATIVE mg/dL   Protein, ur NEGATIVE NEGATIVE mg/dL   Nitrite NEGATIVE NEGATIVE   Leukocytes, UA NEGATIVE NEGATIVE  CBC     Status: None   Collection Time: 05/21/17 12:16 PM  Result Value Ref Range   WBC 7.2 4.0 - 10.5 K/uL   RBC 5.05 4.22 - 5.81 MIL/uL   Hemoglobin 15.1 13.0 - 17.0 g/dL   HCT 46.0 39.0 - 52.0 %   MCV 91.1 78.0 - 100.0 fL   MCH 29.9 26.0 - 34.0 pg   MCHC 32.8 30.0 - 36.0 g/dL   RDW 12.7 11.5 - 15.5 %   Platelets 247 150 - 400 K/uL    Ct Abdomen Pelvis W Contrast  Result Date: 05/20/2017 CLINICAL DATA:  52 year old male presents with abdominal pain. History of incarcerated right inguinal hernia status post reduction and laparoscopic repair on November 5. Patient was having increased pain in early January and swelling. Prior comparison CT revealed a complex fluid collection within the right inguinal canal. Patient believes the fluid has reaccumulated. EXAM: CT ABDOMEN AND PELVIS WITH CONTRAST TECHNIQUE: Multidetector CT imaging of the abdomen and pelvis was performed using the standard protocol following bolus administration of intravenous contrast. CONTRAST:  166m ISOVUE-300 IOPAMIDOL (ISOVUE-300) INJECTION 61% COMPARISON:  05/02/2017 FINDINGS: Lower chest: Normal size heart. No  significant pericardial effusion. Scarring in the left lower lobe versus atelectasis. No effusion or pneumothorax. Hepatobiliary: Hepatic steatosis. No biliary dilatation. Normal gallbladder without stones. Pancreas: Normal Spleen: Normal Adrenals/Urinary Tract: Normal bilateral adrenal glands. Punctate bilateral renal calculi without obstruction. No enhancing renal masses. Bilateral too small to characterize hypodensities compatible statistically with cysts. No significant change. Nondistended urinary bladder without focal mural thickening or calculus. Stomach/Bowel: Stomach is within normal limits. Appendix appears normal. No evidence of bowel wall thickening, distention, or inflammatory changes. Vascular/Lymphatic: Aortoiliac and branch vessel atherosclerosis. No adenopathy. Reproductive: Normal prostate. Other: Mesh repair of inguinal hernias. Musculoskeletal: Redemonstration of fluid within the right included inguinal canal, at its greatest measuring 3 x 5.7 cm by at least 11 cm craniocaudad. Vertebral augmentation at L1. Left hip arthroplasty without complication. IMPRESSION: 1. Reaccumulation of complex fluid within the right inguinal canal extending presumably into  the scrotum which is excluded on this study. The fluid extends at least 11 cm craniocaudad by 3 x 5.7 cm. 2. Bilateral nephrolithiasis without obstructive uropathy. 3. Aortoiliac atherosclerosis. 4. Hepatic steatosis. Electronically Signed   By: Ashley Royalty M.D.   On: 05/20/2017 21:04    Discharge Exam: Blood pressure 128/73, pulse 74, temperature 98.4 F (36.9 C), temperature source Oral, resp. rate 16, height 5' 8"  (1.727 m), weight 90.7 kg (200 lb), SpO2 99 %.  General: Pt awake/alert/oriented x4 in No acute distress Eyes: PERRL, normal EOM.  Sclera clear.  No icterus Neuro: CN II-XII intact w/o focal sensory/motor deficits. Lymph: No head/neck/groin lymphadenopathy Psych:  No delerium/psychosis/paranoia HENT: Normocephalic, Mucus  membranes moist.  No thrush Neck: Supple, No tracheal deviation Chest: No chest wall pain w good excursion CV:  Pulses intact.  Regular rhythm MS: Normal AROM mjr joints.  No obvious deformity Abdomen: Soft.  Nondistended.  Nontender.  No evidence of peritonitis.  No incarcerated hernias.  Some fullness in right upper and lower scrotum consistent with seroma but soft.  No erythema.  Mild sensitivity.  Mild discomfort in right groin stable.  No evidence of hernia recurrence.  No evidence of any cellulitis or abscess.  Ext:  SCDs BLE.  No mjr edema.  No cyanosis Skin: No petechiae / purpura  Past Medical History:  Diagnosis Date  . High cholesterol   . Hypertension   . Infected hernioplasty mesh 2016 s/p explantation 05/21/2017  . Wound infection after surgery 10/23/2014    Past Surgical History:  Procedure Laterality Date  . BACK SURGERY    . HERNIA MESH REMOVAL  2016   removal of infected meshhad right inguinal hernia.  Open wound.  Closed August 2016  . INGUINAL HERNIA REPAIR Bilateral 03/04/2017   Procedure: LAPAROSCOPIC BILATERAL INGUINAL HERNIA REPAIR WITH MESH;  Surgeon: Michael Boston, MD;  Location: [facility];  Service: General;  Laterality: Bilateral;  . INGUINAL HERNIA REPAIR  2015   emergent open repair for incarcerated right inguinal hernia  . INGUINAL HERNIA REPAIR Right 2016   open repair with mesh for recurrent right inguinal hernia.  [facility]  03/04/2017   Procedure: INSERTION OF MESH;  Surgeon: Michael Boston, MD;  Location: [facility];  Service: General;;  . REPLACEMENT TOTAL HIP W/  RESURFACING IMPLANTS      Social History   Socioeconomic History  . Marital status: Married    Spouse name: Not on file  . Number of children: Not on file  . Years of education: Not on file  . Highest education level: Not on file  Social Needs  . Financial resource strain: Not on file  . Food insecurity - worry: Not on file  . Food insecurity -  inability: Not on file  . Transportation needs - medical: Not on file  . Transportation needs - non-medical: Not on file  Occupational History  . Not on file  Tobacco Use  . Smoking status: Never Smoker  . Smokeless tobacco: Never Used  Substance and Sexual Activity  . Alcohol use: Yes  . Drug use: No  . Sexual activity: Not on file  Other Topics Concern  . Not on file  Social History Narrative  . Not on file    No family history on file.  No current facility-administered medications for this encounter.    Current Outpatient Medications  Medication Sig Dispense Refill  . acetaminophen (TYLENOL) 500 MG tablet Take 500-1,000 mg  by mouth every 6 (six) hours as needed (for pain).    Marland Kitchen aspirin EC 81 MG tablet Take 81 mg by mouth daily.     Marland Kitchen doxycycline (VIBRAMYCIN) 100 MG capsule Take 100 mg by mouth 2 (two) times daily.  1  . losartan (COZAAR) 50 MG tablet Take 50 mg by mouth daily.  3  . naproxen (NAPROSYN) 500 MG tablet Take 1 tablet (500 mg total) every 12 (twelve) hours as needed by mouth for mild pain or moderate pain. 30 tablet 1  . simvastatin (ZOCOR) 40 MG tablet Take 40 mg by mouth daily.     Marland Kitchen gabapentin (NEURONTIN) 300 MG capsule Take 2 capsules (600 mg total) by mouth 3 (three) times daily. 100 capsule 2  . oxyCODONE (OXY IR/ROXICODONE) 5 MG immediate release tablet Take 1-2 tablets (5-10 mg total) by mouth every 6 (six) hours as needed for breakthrough pain. 30 tablet 0     Allergies  Allergen Reactions  . Codeine Anaphylaxis and Hives    Swelling of the throat and body breaks out in blisters  . Sulfa Antibiotics Rash and Other (See Comments)    Breaks out in blisters also    Signed: Morton Peters, M.D., F.A.C.S. Gastrointestinal and Minimally Invasive Surgery [facility] [address], [place], [place] [address] [phone] Main / Paging   05/22/2017, 7:40 AM

## 2017-08-08 ENCOUNTER — Other Ambulatory Visit: Payer: Self-pay | Admitting: Surgery

## 2017-08-08 DIAGNOSIS — R1031 Right lower quadrant pain: Secondary | ICD-10-CM

## 2017-08-15 ENCOUNTER — Ambulatory Visit
Admission: RE | Admit: 2017-08-15 | Discharge: 2017-08-15 | Disposition: A | Discharge status: Home / Self Care | Payer: BLUE CROSS/BLUE SHIELD | Source: Ambulatory Visit | Attending: Surgery | Admitting: Surgery

## 2017-08-15 DIAGNOSIS — R1031 Right lower quadrant pain: Secondary | ICD-10-CM

## 2017-08-15 MED ORDER — GADOBENATE DIMEGLUMINE 529 MG/ML IV SOLN
19.0000 mL | Freq: Once | INTRAVENOUS | Status: AC | PRN
  Administered 2017-08-15: 19 mL via INTRAVENOUS

## 2017-12-12 ENCOUNTER — Emergency Department (HOSPITAL_COMMUNITY): Payer: BLUE CROSS/BLUE SHIELD

## 2017-12-12 ENCOUNTER — Other Ambulatory Visit: Payer: Self-pay

## 2017-12-12 ENCOUNTER — Encounter (HOSPITAL_COMMUNITY): Payer: Self-pay | Admitting: Emergency Medicine

## 2017-12-12 ENCOUNTER — Emergency Department (HOSPITAL_COMMUNITY)
Admission: EM | Admit: 2017-12-12 | Discharge: 2017-12-12 | Disposition: A | Discharge status: Home / Self Care | Payer: BLUE CROSS/BLUE SHIELD | Attending: Emergency Medicine | Admitting: Emergency Medicine

## 2017-12-12 DIAGNOSIS — Z79899 Other long term (current) drug therapy: Secondary | ICD-10-CM | POA: Diagnosis not present

## 2017-12-12 DIAGNOSIS — K4091 Unilateral inguinal hernia, without obstruction or gangrene, recurrent: Secondary | ICD-10-CM | POA: Insufficient documentation

## 2017-12-12 DIAGNOSIS — I10 Essential (primary) hypertension: Secondary | ICD-10-CM | POA: Insufficient documentation

## 2017-12-12 DIAGNOSIS — R1031 Right lower quadrant pain: Secondary | ICD-10-CM | POA: Diagnosis present

## 2017-12-12 DIAGNOSIS — R52 Pain, unspecified: Secondary | ICD-10-CM

## 2017-12-12 DIAGNOSIS — Z7982 Long term (current) use of aspirin: Secondary | ICD-10-CM | POA: Insufficient documentation

## 2017-12-12 LAB — URINALYSIS, ROUTINE W REFLEX MICROSCOPIC
Bilirubin Urine: NEGATIVE
Glucose, UA: NEGATIVE mg/dL
Hgb urine dipstick: NEGATIVE
KETONES UR: NEGATIVE mg/dL
LEUKOCYTES UA: NEGATIVE
Nitrite: NEGATIVE
PROTEIN: NEGATIVE mg/dL
Specific Gravity, Urine: 1.015 (ref 1.005–1.030)
pH: 7 (ref 5.0–8.0)

## 2017-12-12 LAB — CBC WITH DIFFERENTIAL/PLATELET
ABS IMMATURE GRANULOCYTES: 0 10*3/uL (ref 0.0–0.1)
BASOS ABS: 0 10*3/uL (ref 0.0–0.1)
BASOS PCT: 0 %
Eosinophils Absolute: 0.2 10*3/uL (ref 0.0–0.7)
Eosinophils Relative: 3 %
HCT: 48.8 % (ref 39.0–52.0)
Hemoglobin: 16 g/dL (ref 13.0–17.0)
Immature Granulocytes: 0 %
Lymphocytes Relative: 23 %
Lymphs Abs: 1.9 10*3/uL (ref 0.7–4.0)
MCH: 29.6 pg (ref 26.0–34.0)
MCHC: 32.8 g/dL (ref 30.0–36.0)
MCV: 90.2 fL (ref 78.0–100.0)
MONOS PCT: 12 %
Monocytes Absolute: 1 10*3/uL (ref 0.1–1.0)
NEUTROS ABS: 5 10*3/uL (ref 1.7–7.7)
Neutrophils Relative %: 62 %
PLATELETS: 323 10*3/uL (ref 150–400)
RBC: 5.41 MIL/uL (ref 4.22–5.81)
RDW: 12.4 % (ref 11.5–15.5)
WBC: 8 10*3/uL (ref 4.0–10.5)

## 2017-12-12 LAB — BASIC METABOLIC PANEL
ANION GAP: 8 (ref 5–15)
BUN: 10 mg/dL (ref 6–20)
CALCIUM: 9.5 mg/dL (ref 8.9–10.3)
CO2: 24 mmol/L (ref 22–32)
Chloride: 107 mmol/L (ref 98–111)
Creatinine, Ser: 0.83 mg/dL (ref 0.61–1.24)
GFR calc Af Amer: >60 mL/min (ref >60)
Glucose, Bld: 90 mg/dL (ref 70–99)
POTASSIUM: 4.2 mmol/L (ref 3.5–5.1)
Sodium: 139 mmol/L (ref 135–145)

## 2017-12-12 MED ORDER — OXYCODONE-ACETAMINOPHEN 5-325 MG PO TABS: 1.0000 | ORAL_TABLET | Freq: Four times a day (QID) | ORAL | 0 refills | Status: AC | PRN

## 2017-12-12 MED ORDER — OXYCODONE-ACETAMINOPHEN 5-325 MG PO TABS
1.0000 | ORAL_TABLET | Freq: Once | ORAL | Status: AC
  Administered 2017-12-12: 1 via ORAL
  Filled 2017-12-12: qty 1

## 2017-12-12 NOTE — ED Notes (Signed)
EDP at bedside  

## 2017-12-12 NOTE — ED Notes (Signed)
E-signature not available, pt verbalized understanding of DC instructions and prescriptions 

## 2017-12-12 NOTE — ED Triage Notes (Signed)
Pt was scheduled for an ultrasound of scrotum today for possible returned hernia. 03/04/2017 hernia repair surgery. 3 weeks ago, pt had right testicle removed. Pt started having severe pain and swelling Friday. Pt on the way to ultrasound and unable to tolerate the pain so came here.

## 2017-12-12 NOTE — ED Notes (Signed)
Pt complaining of increased pain. This EMT views wound in bathroom. Groin and scrotum has swollen to grapefruit size. Nurse first made aware.

## 2017-12-12 NOTE — ED Provider Notes (Signed)
Patient placed in Quick Look pathway, seen and evaluated   Chief Complaint: swelling of the testicle  HPI:   Logan Chang is a 52 y.o. with pain in the scrotal area. Patient reports he was scheduled for an ultrasound today but before time the pain became so intense that he could not wait. Patient had hernea repair 03/04/17 and 3 weeks ago the patient had right testicle removed.   ROS: left testicular pain  Physical Exam:  BP 111/70   Pulse 87   Temp 98.4 F (36.9 C) (Oral)   Resp 16   Ht 5\' 8"  (1.727 m)   Wt 90.7 kg   SpO2 97%   BMI 30.41 kg/m    Gen: No distress appears uncomfortable  Neuro: Awake and Alert  Skin: Warm and dry   Labs and u/s ordered  Initiation of care has begun. The patient has been counseled on the process, plan, and necessity for staying for the completion/evaluation, and the remainder of the medical screening examination    Ashley Murrain, NP 12/12/17 1601    Julianne Rice, MD 12/13/17 [phone]

## 2017-12-12 NOTE — Discharge Instructions (Signed)
These return for any problem.  Follow-up with your surgeon -Dr. Alwyn Pea -tomorrow.

## 2017-12-19 NOTE — ED Provider Notes (Signed)
[facility] Provider Note   CSN: [id number] Arrival date & time: 12/12/17  1502     History   Chief Complaint Chief Complaint  Patient presents with  . Groin Pain    HPI Logan Chang is a 52 y.o. adult.  52 year old male with prior medical history as detailed below presents with complaint of pain in the right inguinal area.  Patient reports inguinal hernia repair in November 2018.  Patient with persistent right scrotal pain following the procedure.  He reports right orchiectomy approximately 3 weeks ago.  He was scheduled for a repeat scrotal ultrasound today when his chronic right scrotal pain became too uncomfortable when he presented to the ED.  Patient complains of persistent pain in the right groin area.  He is concerned that he may have a recurrent inguinal hernia despite prior repair.  He denies any other abdominal pain, nausea, vomiting, fever, change in bowel movement, or other acute complaint.  The history is provided by the patient and medical records.  Groin Pain  This is a chronic problem. The current episode started more than 1 week ago. The problem occurs every several days. The problem has not changed since onset.Pertinent negatives include no chest pain, no abdominal pain, no headaches and no shortness of breath. Nothing aggravates the symptoms. Nothing relieves the symptoms.    Past Medical History:  Diagnosis Date  . High cholesterol   . Hypertension   . Infected hernioplasty mesh 2016 s/p explantation 05/21/2017  . Wound infection after surgery 10/23/2014    Patient Active Problem List   Diagnosis Date Noted  . Nausea, vomiting and diarrhea 05/21/2017  . Inguinodynia, right 05/21/2017  . Anxiety state 05/21/2017  . Groin hematoma, subsequent encounter 05/20/2017  . Left inguinal hernia s/p lap repair 03/04/2017 03/04/2017  . Incarcerated umbilical hernia s/p primary repair 03/04/2017 03/04/2017  . Essential hypertension    . Pure hypercholesterolemia   . Incarcerated recurrent right inguinal hernia s/p reduction & lap repair 03/04/2017 03/01/2017  . Nephrolithiasis 08/24/2014  . Family history of heart disease in male family member before age 70 06/15/2014    Past Surgical History:  Procedure Laterality Date  . BACK SURGERY    . HERNIA MESH REMOVAL  2016   removal of infected meshhad right inguinal hernia.  Open wound.  Closed August 2016  . INGUINAL HERNIA REPAIR Bilateral 03/04/2017   Procedure: LAPAROSCOPIC BILATERAL INGUINAL HERNIA REPAIR WITH MESH;  Surgeon: Michael Boston, MD;  Location: [facility];  Service: General;  Laterality: Bilateral;  . INGUINAL HERNIA REPAIR  2015   emergent open repair for incarcerated right inguinal hernia  . INGUINAL HERNIA REPAIR Right 2016   open repair with mesh for recurrent right inguinal hernia.  [facility] [place]  03/04/2017   Procedure: INSERTION OF MESH;  Surgeon: Michael Boston, MD;  Location: [facility];  Service: General;;  . REPLACEMENT TOTAL HIP W/  RESURFACING IMPLANTS       OB History   None      Home Medications    Prior to Admission medications   Medication Sig Start Date End Date Taking? Authorizing Provider  acetaminophen (TYLENOL) 500 MG tablet Take 500-1,000 mg by mouth every 6 (six) hours as needed (for pain).   Yes [provider]  ibuprofen (ADVIL,MOTRIN) 200 MG tablet Take 800 mg by mouth every 6 (six) hours as needed (for pain).   Yes [provider]  losartan (COZAAR)  50 MG tablet Take 50 mg by mouth daily. 12/15/16  Yes [provider]  simvastatin (ZOCOR) 40 MG tablet Take 40 mg by mouth daily.  02/15/17  Yes [provider]  aspirin EC 81 MG tablet Take 81 mg by mouth daily.  06/15/14   [provider]  gabapentin (NEURONTIN) 300 MG capsule Take 2 capsules (600 mg total) by mouth 3 (three) times daily. Patient not taking: Reported on 12/12/2017 05/21/17   Michael Boston,  MD  naproxen (NAPROSYN) 500 MG tablet Take 1 tablet (500 mg total) every 12 (twelve) hours as needed by mouth for mild pain or moderate pain. Patient not taking: Reported on 12/12/2017 03/04/17   Michael Boston, MD  oxyCODONE (OXY IR/ROXICODONE) 5 MG immediate release tablet Take 1-2 tablets (5-10 mg total) by mouth every 6 (six) hours as needed for breakthrough pain. Patient not taking: Reported on 12/12/2017 05/21/17   Michael Boston, MD  oxyCODONE-acetaminophen (PERCOCET/ROXICET) 5-325 MG tablet Take 1 tablet by mouth every 6 (six) hours as needed for severe pain. 12/12/17   Valarie Merino, MD    Family History No family history on file.  Social History Social History   Tobacco Use  . Smoking status: Never Smoker  . Smokeless tobacco: Never Used  Substance Use Topics  . Alcohol use: Yes  . Drug use: No     Allergies   Codeine; Sulfa antibiotics; Amitriptyline; and Gabapentin   Review of Systems Review of Systems  Respiratory: Negative for shortness of breath.   Cardiovascular: Negative for chest pain.  Gastrointestinal: Negative for abdominal pain.  Neurological: Negative for headaches.  All other systems reviewed and are negative.    Physical Exam Updated Vital Signs BP 119/86   Pulse 67   Temp 98.4 F (36.9 C) (Oral)   Resp 16   Ht 5\' 8"  (1.727 m)   Wt 90.7 kg   SpO2 95%   BMI 30.41 kg/m   Physical Exam  Constitutional: He is oriented to person, place, and time. He appears well-developed and well-nourished. No distress.  HENT:  Head: Normocephalic and atraumatic.  Mouth/Throat: Oropharynx is clear and moist.  Eyes: Pupils are equal, round, and reactive to light. Conjunctivae and EOM are normal.  Neck: Normal range of motion. Neck supple.  Cardiovascular: Normal rate, regular rhythm and normal heart sounds.  Pulmonary/Chest: Effort normal and breath sounds normal. No respiratory distress.  Abdominal: Soft. He exhibits no distension. There is no tenderness.    Right inguinal swelling consistent with nonincarcerated inguinal hernia.  Musculoskeletal: Normal range of motion. He exhibits no edema or deformity.  Neurological: He is alert and oriented to person, place, and time.  Skin: Skin is warm and dry.  Psychiatric: He has a normal mood and affect.  Nursing note and vitals reviewed.    ED Treatments / Results  Labs (all labs ordered are listed, but only abnormal results are displayed) Labs Reviewed  URINALYSIS, ROUTINE W REFLEX MICROSCOPIC - Abnormal; Notable for the following components:      Result Value   APPearance CLOUDY (*)    All other components within normal limits  CBC WITH DIFFERENTIAL/PLATELET  BASIC METABOLIC PANEL    EKG None  Radiology No results found.  Procedures Procedures (including critical care time)  Medications Ordered in ED Medications  oxyCODONE-acetaminophen (PERCOCET/ROXICET) 5-325 MG per tablet 1 tablet (1 tablet Oral Given 12/12/17 1540)     Initial Impression / Assessment and Plan / ED Course  I have reviewed the  triage vital signs and the nursing notes.  Pertinent labs & imaging results that were available during my care of the patient were reviewed by me and considered in my medical decision making (see chart for details).     MDM  Screen complete  Presenting with right inguinal pain.  Patient's pain is most likely secondary to recurrent right inguinal hernia status post prior repair. No evidence on workup today of more acute pathology. No evidence of incarcerated hernia.  Case discussed with general surgery (Tsuei) they agree with the management thus far.  They do suggest close follow-up as an outpatient.  Patient feels comfortable at time of discharge understands need for close follow-up.  He reports to me that he will follow-up with his surgeon (Dr. Johney Maine) tomorrow. Strict return precautions given and understood (especially as with regard to incarceration).  Final Clinical  Impressions(s) / ED Diagnoses   Final diagnoses:  Pain  Unilateral recurrent inguinal hernia without obstruction or gangrene    ED Discharge Orders         Ordered    oxyCODONE-acetaminophen (PERCOCET/ROXICET) 5-325 MG tablet  Every 6 hours PRN     12/12/17 2242           Valarie Merino, MD 12/19/17 1500

## 2019-03-23 IMAGING — US US SCROTUM W/ DOPPLER COMPLETE
1 series · 13 of 25 positions shown · non-contrast
Comparison: None.

CLINICAL DATA: Testicular pain. Status post laparoscopic bilateral
inguinal hernia repair with mesh on 03/04/2017.

EXAM:
SCROTAL ULTRASOUND
DOPPLER ULTRASOUND OF THE TESTICLES
TECHNIQUE: Complete ultrasound examination of the testicles, epididymis, and
other scrotal structures was performed. Color and spectral Doppler
ultrasound were also utilized to evaluate blood flow to the
testicles.

[Series 1: us scrotum w/ doppler complete · 0.06mm/px · 81 acquisitions, 13 frames shown]
[im 1/81]
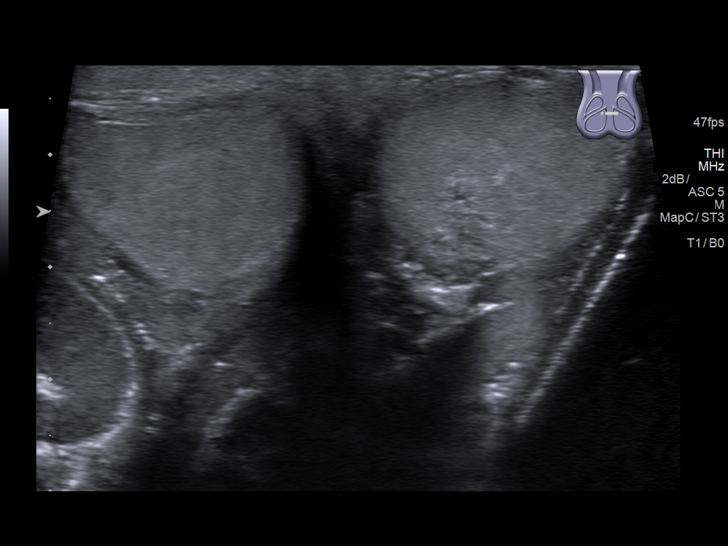
[im 7/81]
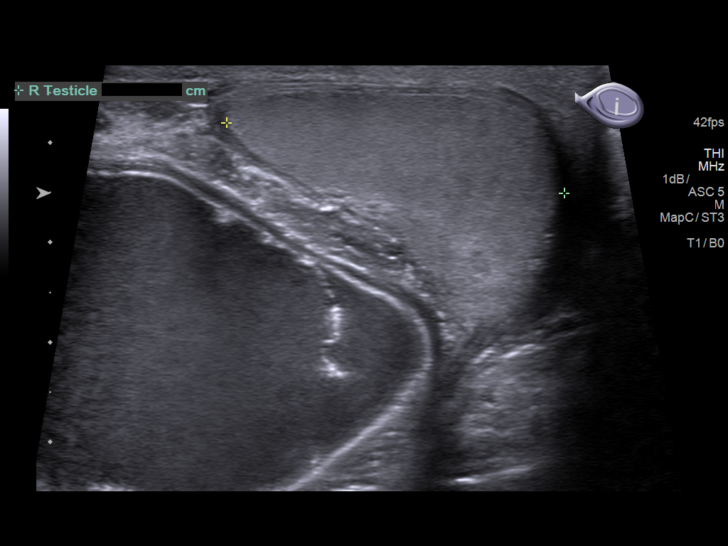
[im 14/81]
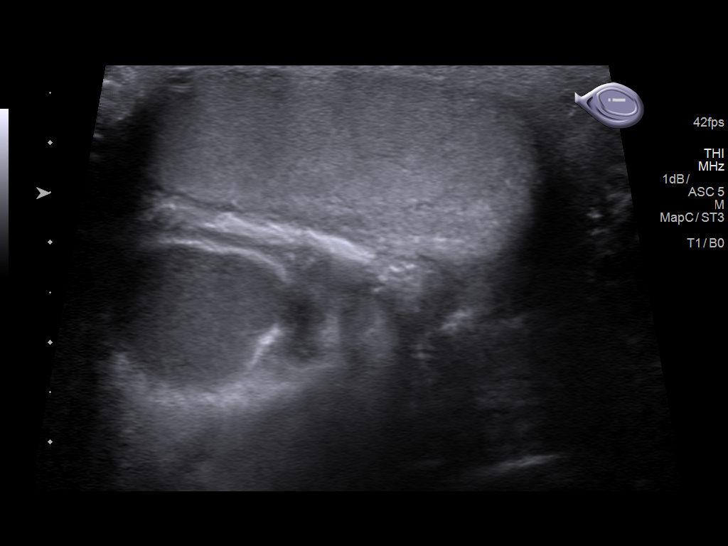
[im 21/81]
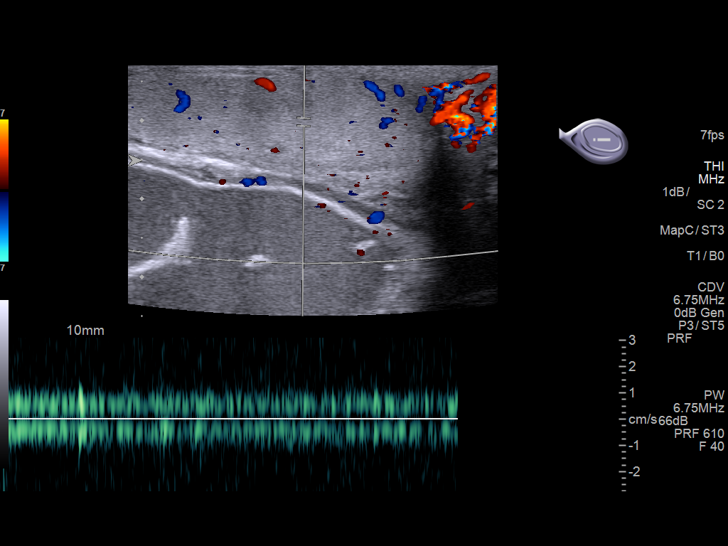
[im 27/81]
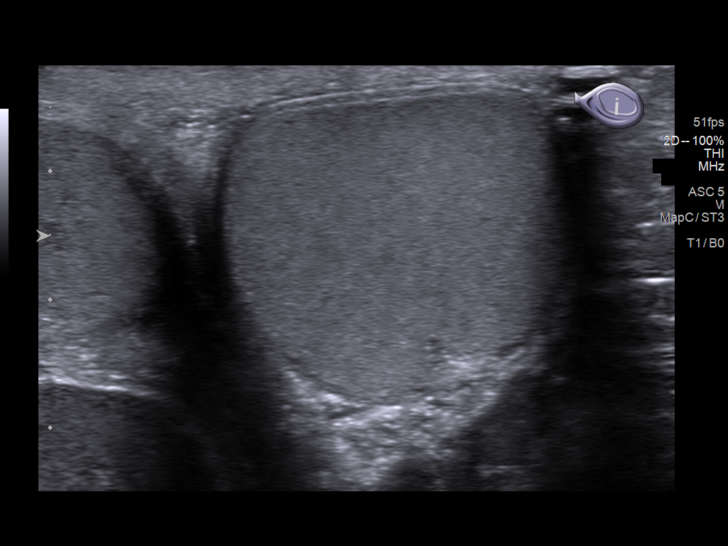
[im 34/81]
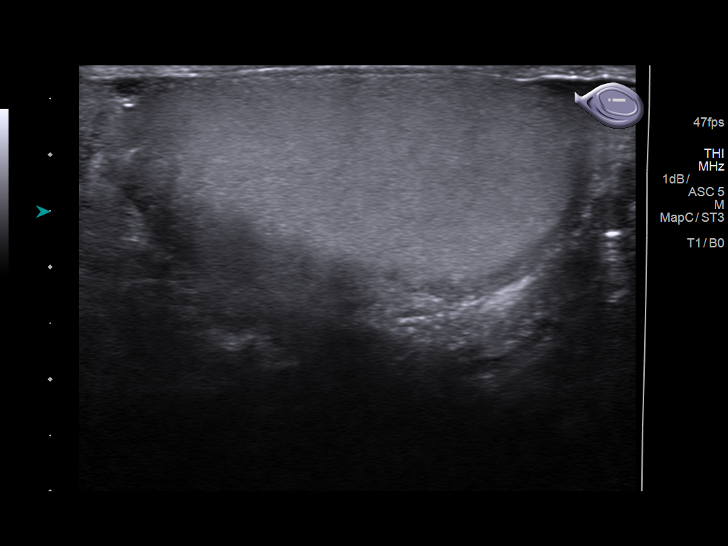
[im 41/81]
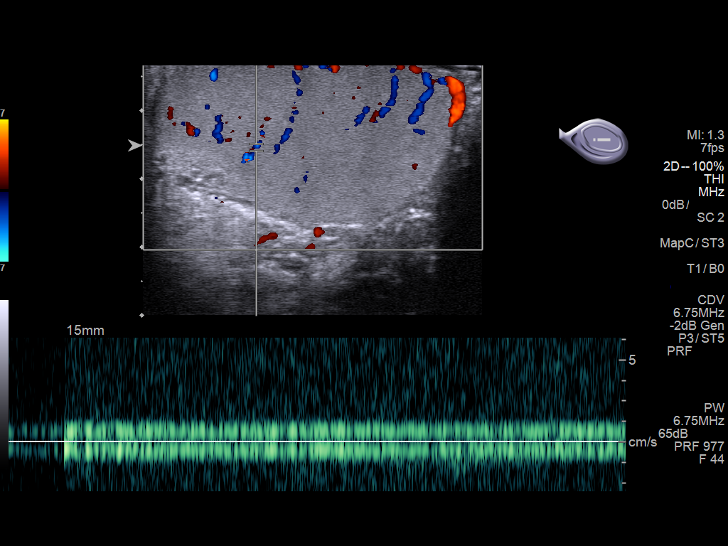
[im 47/81]
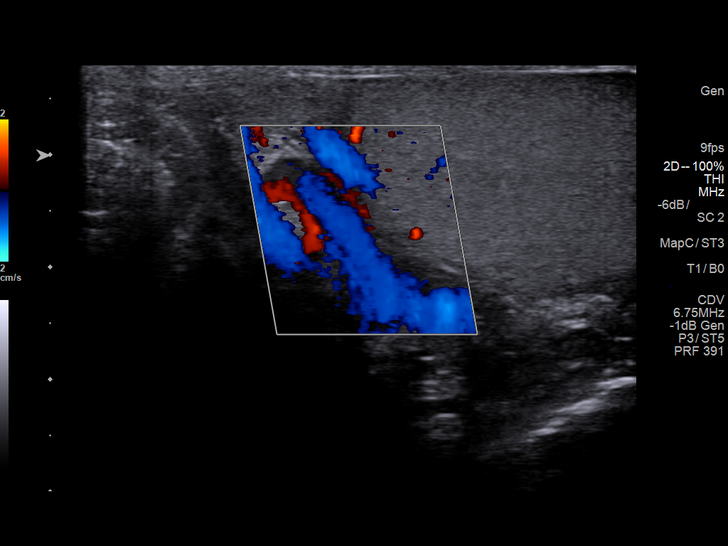
[im 54/81]
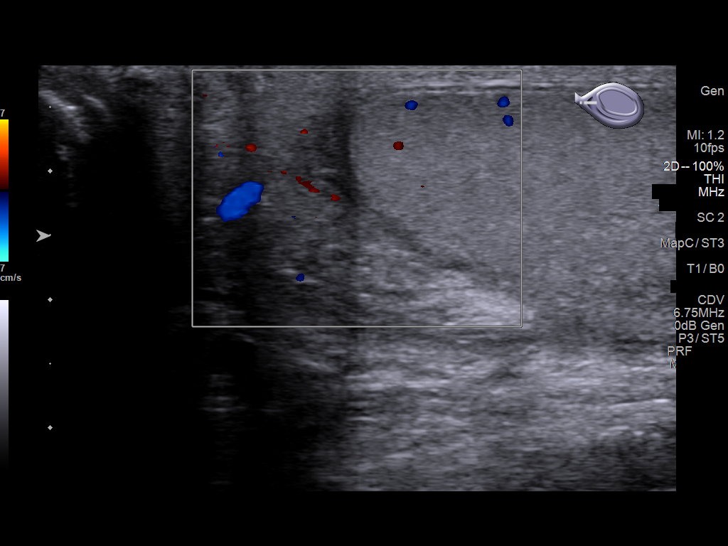
[im 61/81]
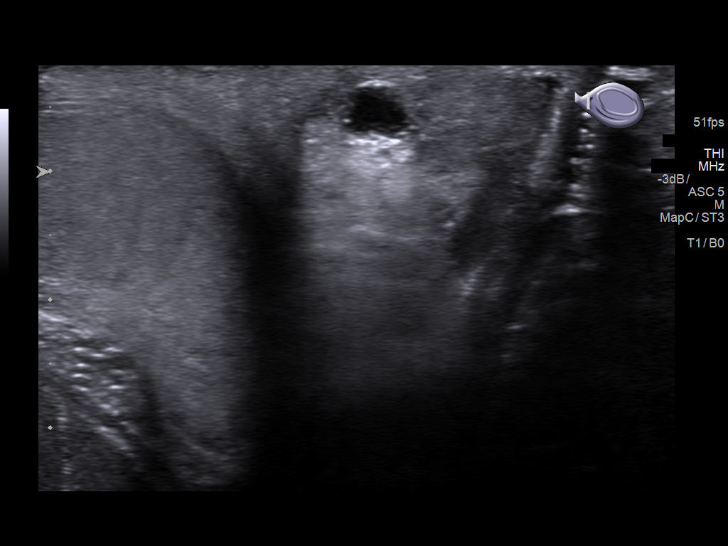
[im 67/81]
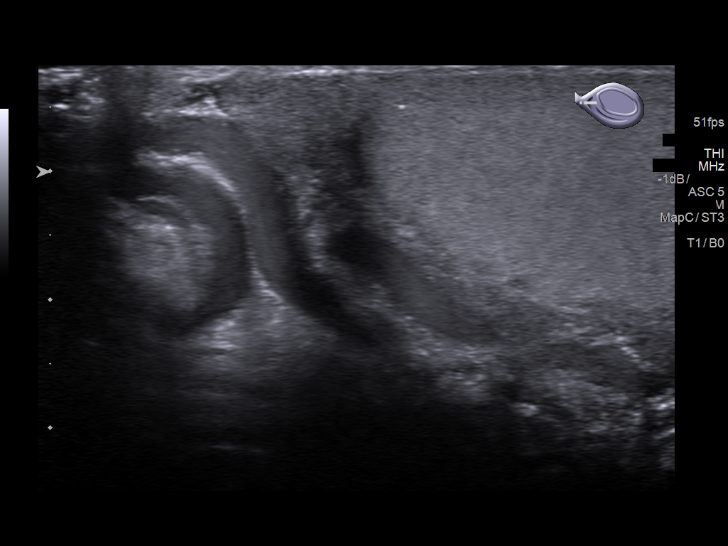
[im 74/81]
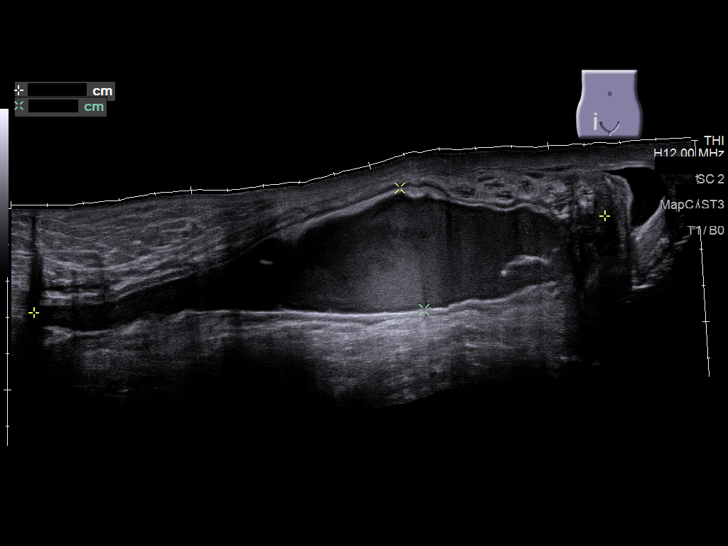
[im 81/81]
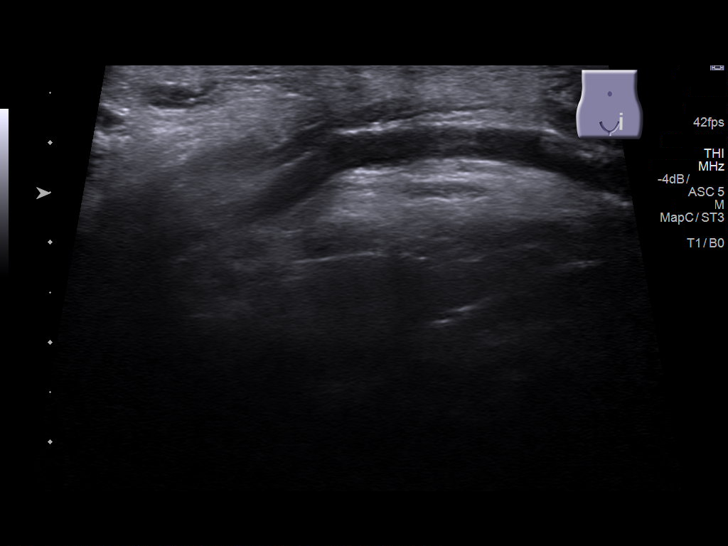

[13 of 25 positions shown; findings below may reference images not displayed]

FINDINGS: Right testicle

Measurements: 5.3 x 2.0 x 3.5 cm. No mass or microlithiasis
visualized. There is a large complex fluid collection in the region
of the right inguinal canal extending down into the right
hemiscrotum. This collection has been incompletely visualized by
ultrasound but measures 15.9 x 3.4 x 6.2 cm.

Left testicle

Measurements: 4.5 x 2.4 x 2.7 cm. No mass or microlithiasis
visualized.

Right epididymis:  Normal in size and appearance.

Left epididymis:  Tiny cyst or spermatocele.

Hydrocele:  None visualized.

Varicocele:  Left-sided.

Pulsed Doppler interrogation of both testes demonstrates normal low
resistance arterial and venous waveforms bilaterally.
IMPRESSION: 1. Large complex fluid collection in the region of the right
inguinal canal contains diffuse low level internal echoes and
extends down into the hemiscrotum. Typical reticulation of chronic
hematoma is not visualized. Nevertheless, this could represent old
blood products or a serous collection with internal debris.
Superinfection of this fluid cannot be excluded by ultrasound. No
bowel could be visualized with Valsalva maneuver, but recurrent
hernia is also possibility. CT imaging of the pelvis may prove
helpful to further evaluate. If CT is performed, use of intravenous
contrast material is suggested.
2. Left varicocele.
These results will be called to the ordering clinician or
representative by the Radiologist Assistant, and communication
documented in the PACS or zVision Dashboard.

## 2019-04-20 IMAGING — CT CT ABD-PELV W/ CM
2 of 7 series · 16 of 46 positions shown, 18 images · IV contrast (iopamidol)
Comparison: 05/02/2017

CLINICAL DATA: 51-year-old male presents with abdominal pain.
History of incarcerated right inguinal hernia status post reduction
and laparoscopic repair on [DATE]. Patient was having increased
pain in early [REDACTED] and swelling. Prior comparison CT revealed a
complex fluid collection within the right inguinal canal. Patient
believes the fluid has reaccumulated.

EXAM:
CT ABDOMEN AND PELVIS WITH CONTRAST
TECHNIQUE: Multidetector CT imaging of the abdomen and pelvis was performed
using the standard protocol following bolus administration of
intravenous contrast.
CONTRAST:  100mL J7YERX-SGG IOPAMIDOL (J7YERX-SGG) INJECTION 61%

[Series 3: abd/ pelvis 5.0 i30f 2 · axial · 0.84mm/px · z∈[+775,+1240]mm · 13 of 107 slices shown, 15 images]
[im 7/107  soft-tissue]
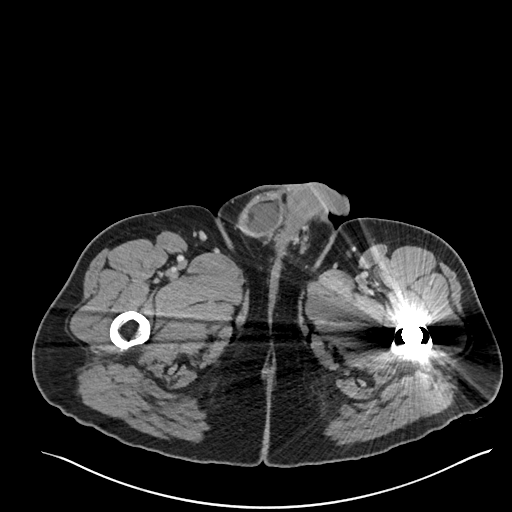
[im 7/107  bone]
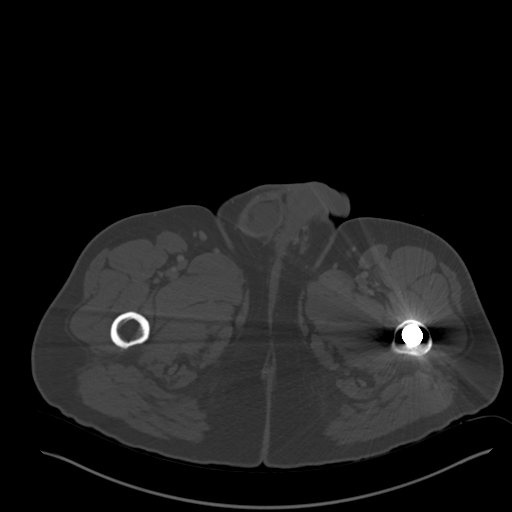
[im 13/107  soft-tissue]
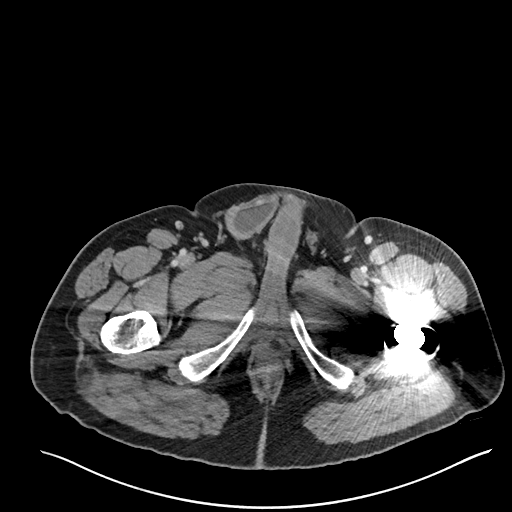
[im 25/107  soft-tissue]
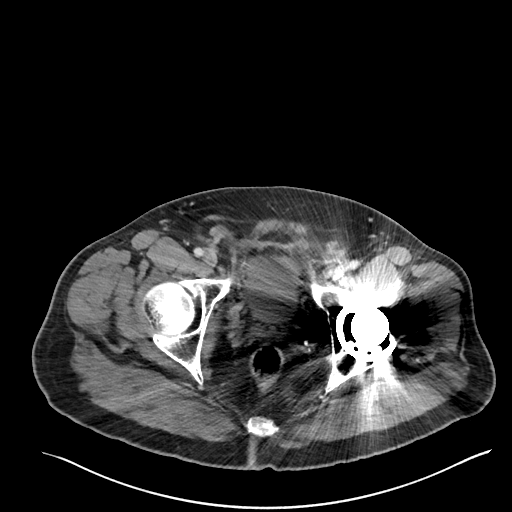
[im 32/107  soft-tissue]
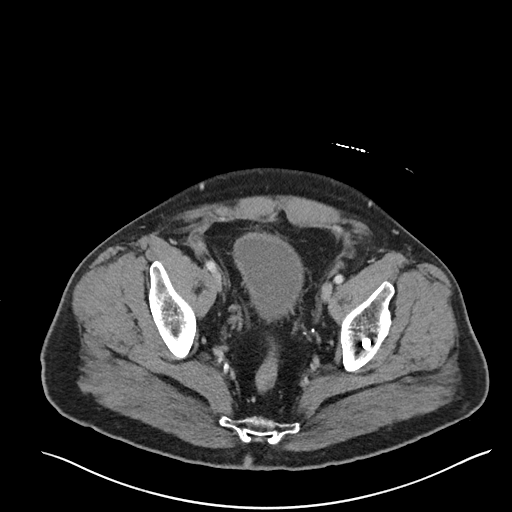
[im 38/107  soft-tissue]
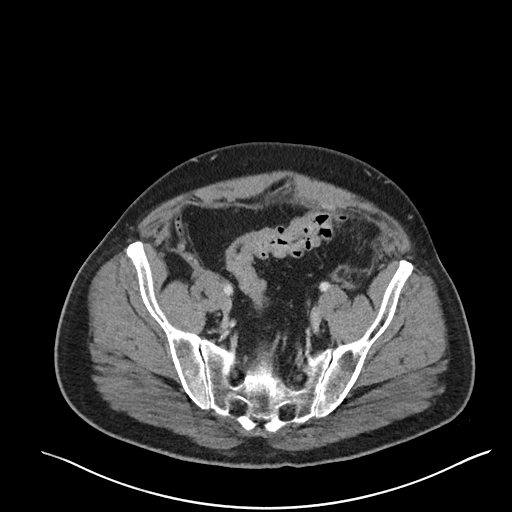
[im 44/107  soft-tissue]
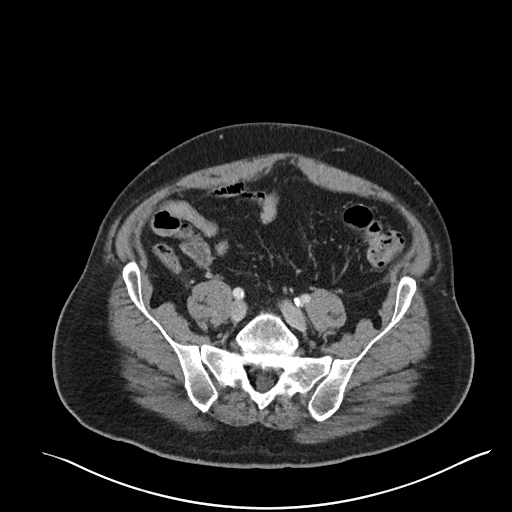
[im 57/107  soft-tissue]
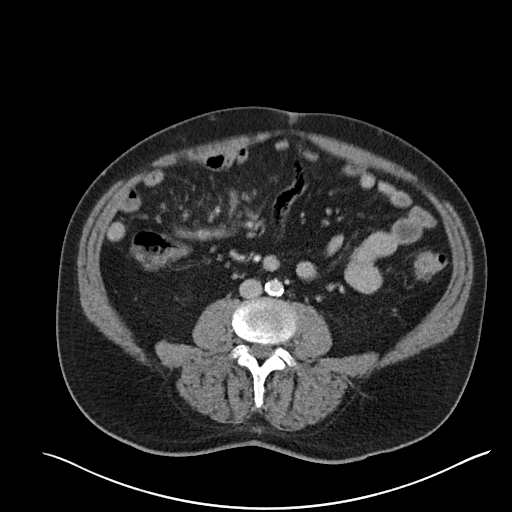
[im 63/107  soft-tissue]
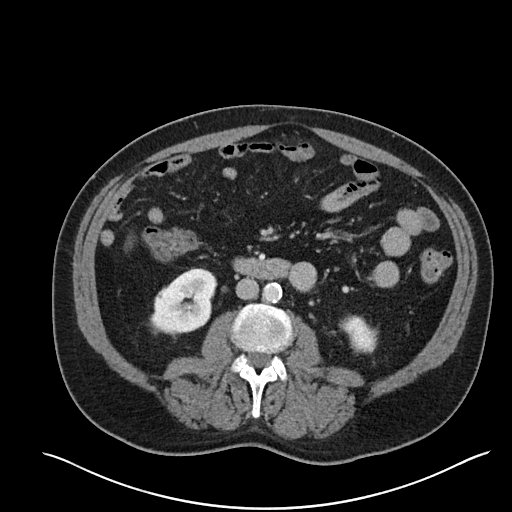
[im 69/107  soft-tissue]
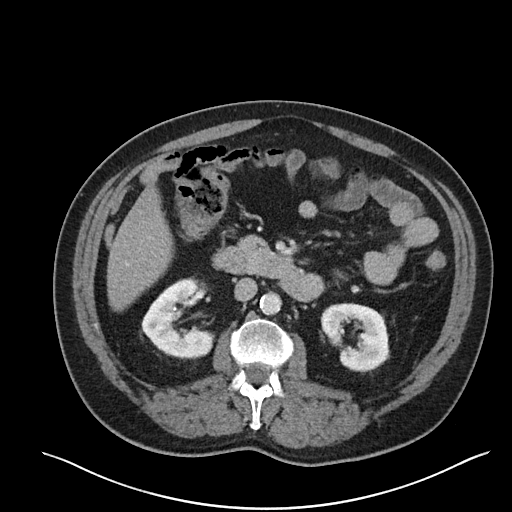
[im 69/107  bone]
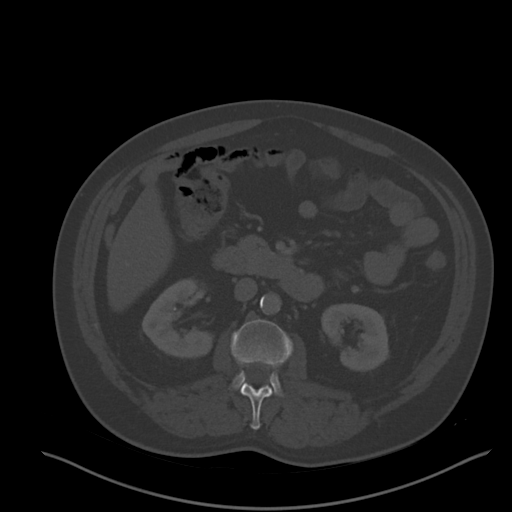
[im 75/107  soft-tissue]
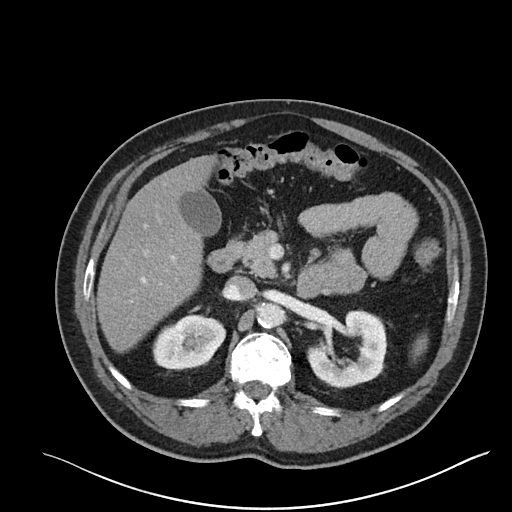
[im 82/107  soft-tissue]
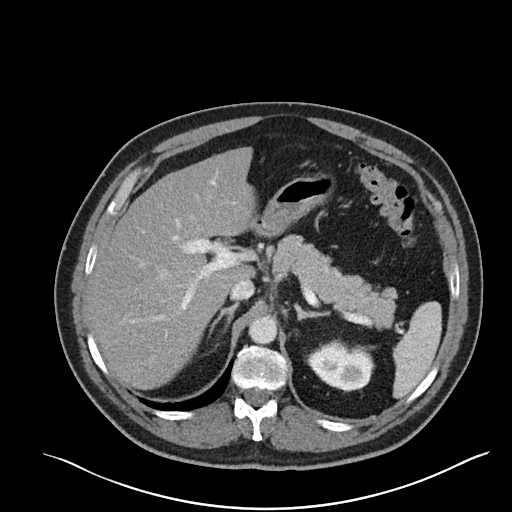
[im 94/107  soft-tissue]
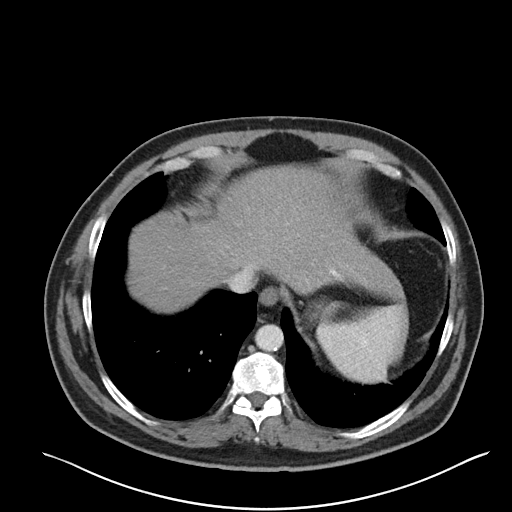
[im 100/107  soft-tissue]
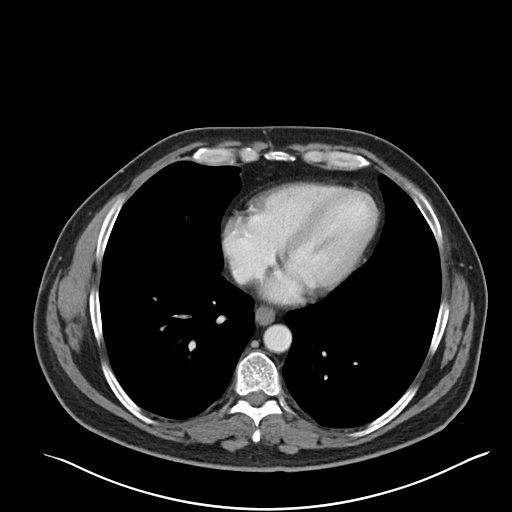

[Series 6: coronal soft tissue · coronal · 0.89mm/px · 3 of 96 slices shown]
[im 32/96  soft-tissue]
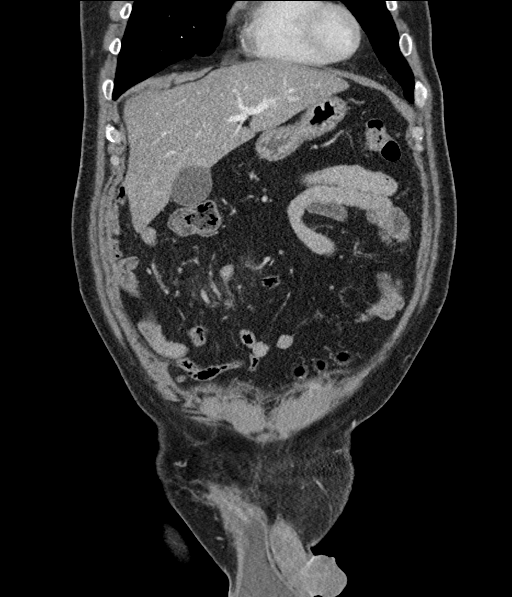
[im 43/96  soft-tissue]
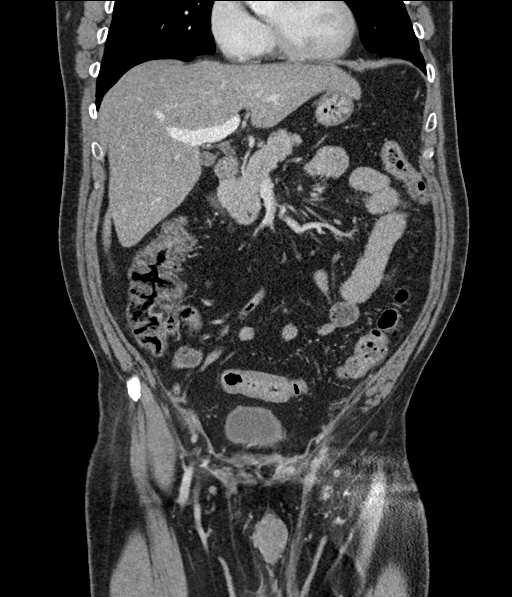
[im 53/96  soft-tissue]
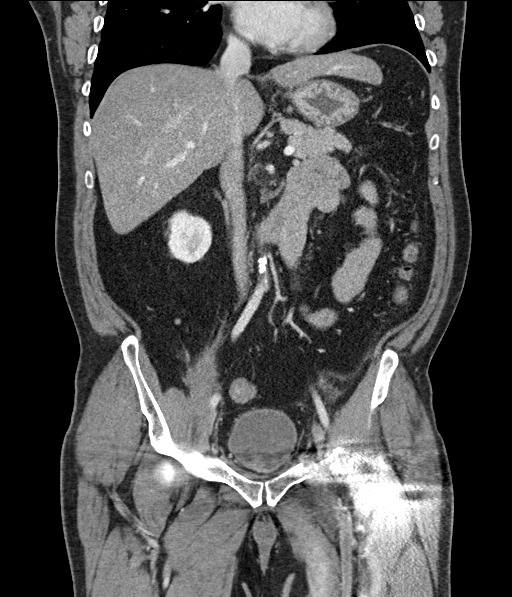

[16 of 46 positions shown; findings below may reference images not displayed]

FINDINGS: Lower chest: Normal size heart. No significant pericardial effusion.
Scarring in the left lower lobe versus atelectasis. No effusion or
pneumothorax.

Hepatobiliary: Hepatic steatosis. No biliary dilatation. Normal
gallbladder without stones.

Pancreas: Normal

Spleen: Normal

Adrenals/Urinary Tract: Normal bilateral adrenal glands. Punctate
bilateral renal calculi without obstruction. No enhancing renal
masses. Bilateral too small to characterize hypodensities compatible
statistically with cysts. No significant change. Nondistended
urinary bladder without focal mural thickening or calculus.

Stomach/Bowel: Stomach is within normal limits. Appendix appears
normal. No evidence of bowel wall thickening, distention, or
inflammatory changes.

Vascular/Lymphatic: Aortoiliac and branch vessel atherosclerosis. No
adenopathy.

Reproductive: Normal prostate.

Other: Mesh repair of inguinal hernias.

Musculoskeletal: Redemonstration of fluid within the right included
inguinal canal, at its greatest measuring 3 x 5.7 cm by at least 11
cm craniocaudad. Vertebral augmentation at L1. Left hip arthroplasty
without complication.
IMPRESSION: 1. Reaccumulation of complex fluid within the right inguinal canal
extending presumably into the scrotum which is excluded on this
study. The fluid extends at least 11 cm craniocaudad by 3 x 5.7 cm.
2. Bilateral nephrolithiasis without obstructive uropathy.
3. Aortoiliac atherosclerosis.
4. Hepatic steatosis.

## 2019-07-16 IMAGING — MR MR PELVIS WO/W CM
5 of 9 series · 21 of 48 positions shown · IV contrast (multihance)
Comparison: CT scans dated 05/20/2017, 05/02/2017, 10/16/2014 and
10/14/2014 and 04/27/2014

CLINICAL DATA: Right groin pain.

Creatinine was obtained on site at [HOSPITAL] at [HOSPITAL].
Results: Creatinine 1.1 mg/dL.
EXAM:
MRI PELVIS WITHOUT AND WITH CONTRAST
TECHNIQUE: Multiplanar multisequence MR imaging of the pelvis was performed
both before and after administration of intravenous contrast.
CONTRAST:  19mL MULTIHANCE GADOBENATE DIMEGLUMINE 529 MG/ML IV SOLN

[Series 3: STIR · coronal · 5.0mm · 0.78mm/px · 3 of 28 slices shown]
[im 1/28]
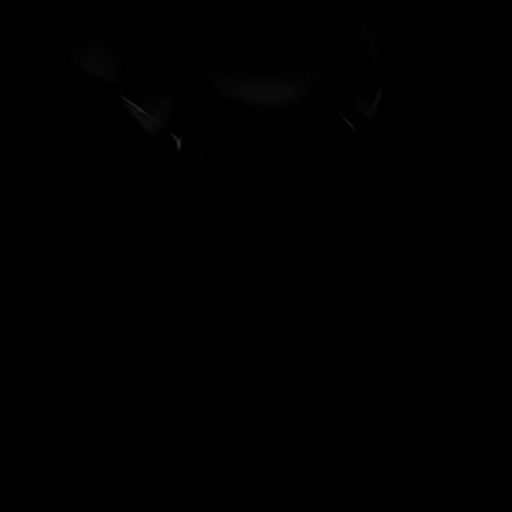
[im 14/28]
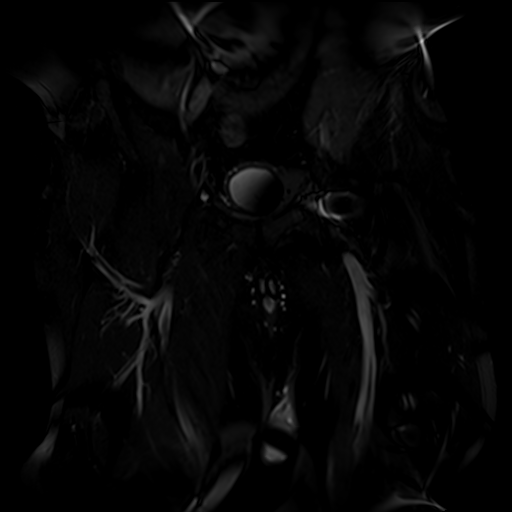
[im 28/28]
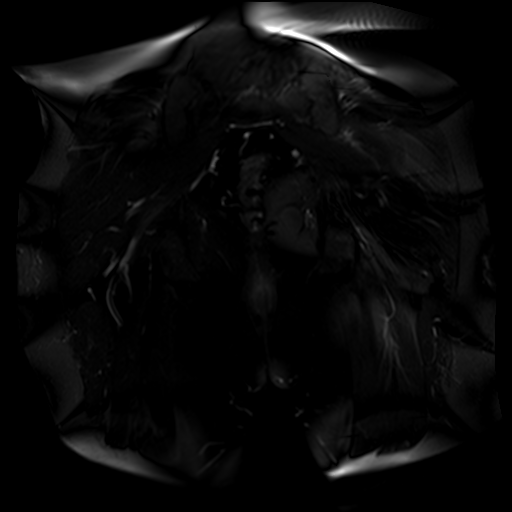

[Series 4: t2_tse axial · axial · 5.0mm · 0.55mm/px · z∈[-94,+110]mm · 5 of 35 slices shown]
[im 1/35]
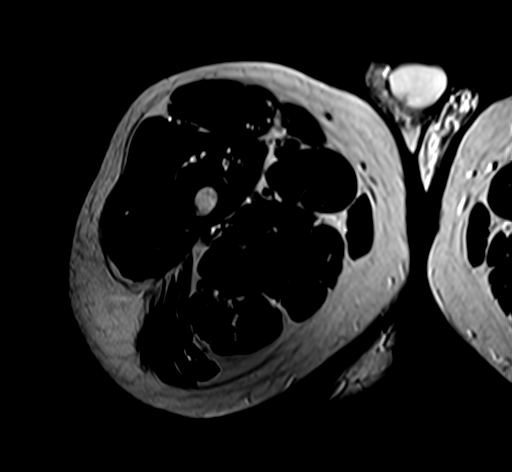
[im 9/35]
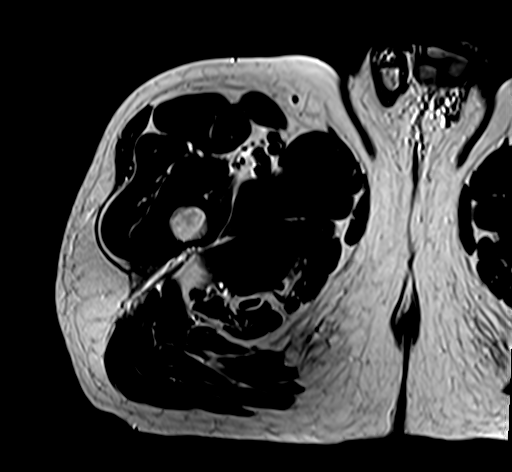
[im 18/35]
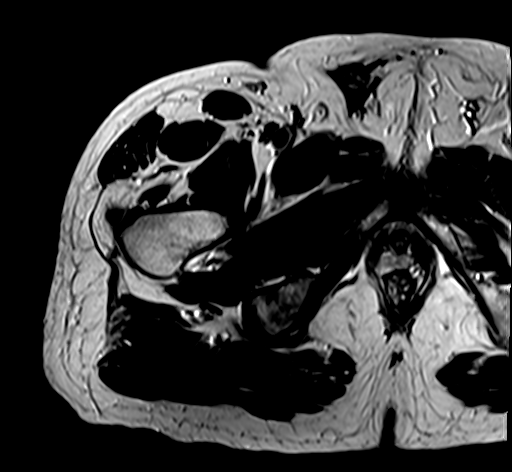
[im 26/35]
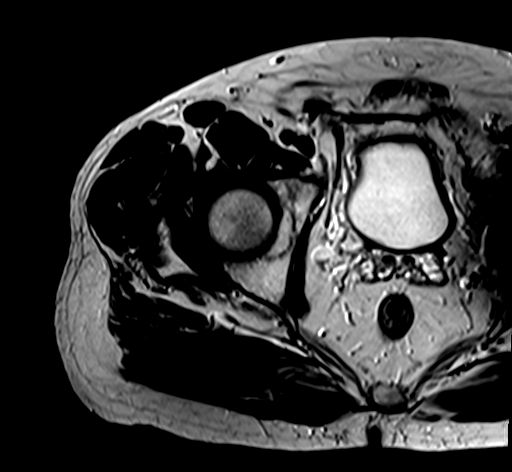
[im 35/35]
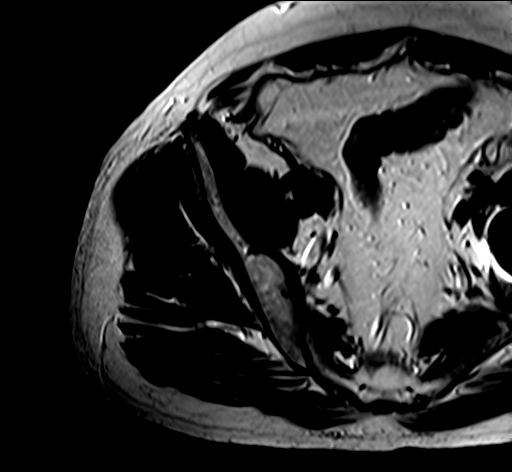

[Series 5: t2_tse axial fs · axial · 5.0mm · 0.55mm/px · z∈[-94,+110]mm · 6 of 35 slices shown]
[im 1/35]
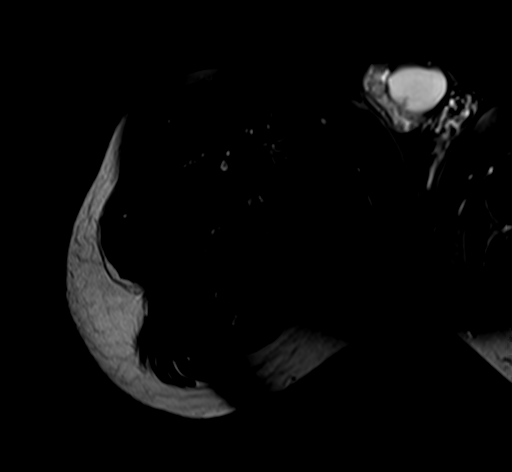
[im 7/35]
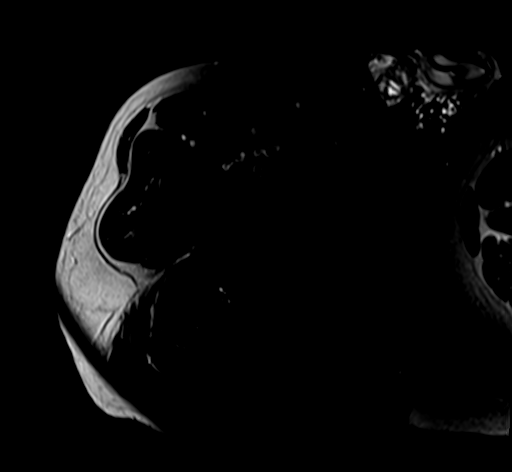
[im 14/35]
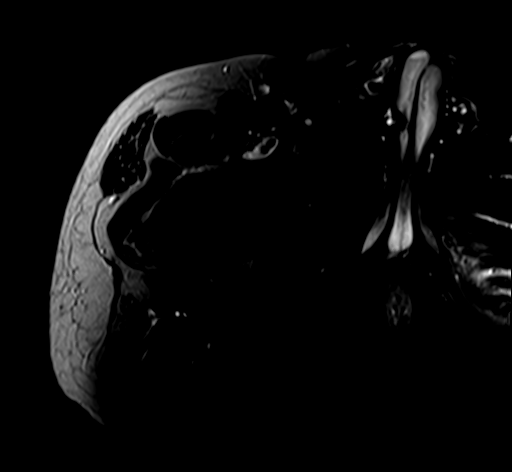
[im 21/35]
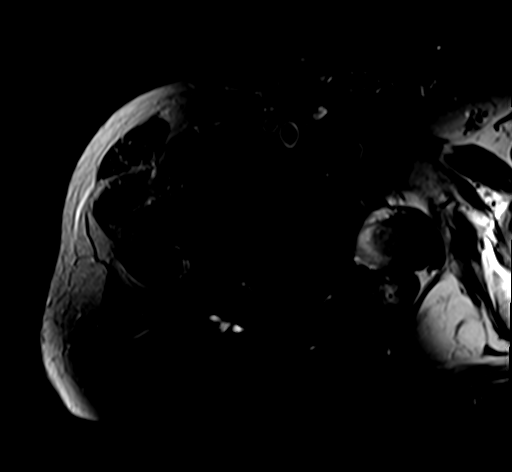
[im 28/35]
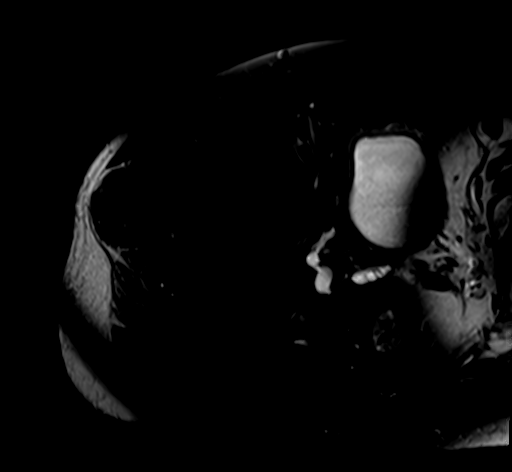
[im 35/35]
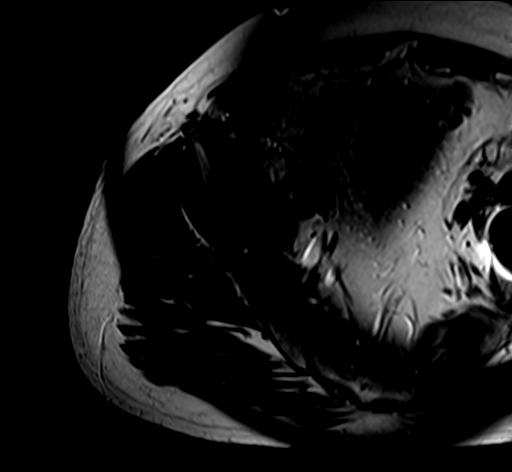

[Series 6: t2_tse_sag · sagittal · 5.0mm · 0.51mm/px · 6 of 35 slices shown]
[im 1/35]
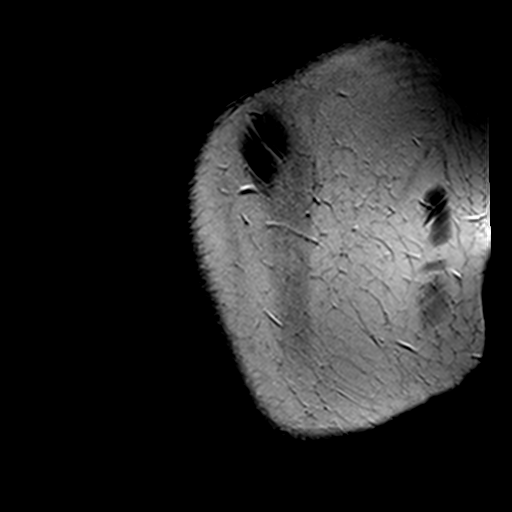
[im 7/35]
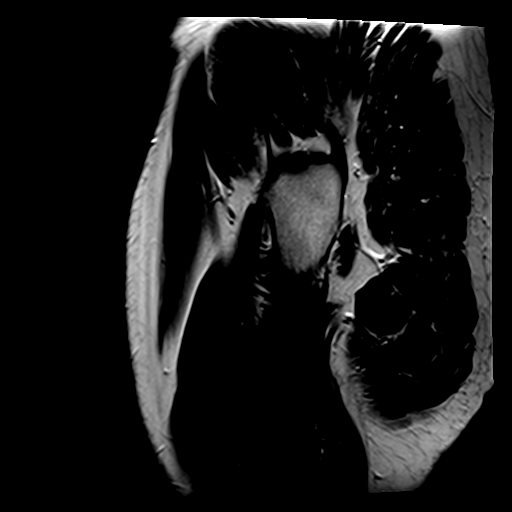
[im 14/35]
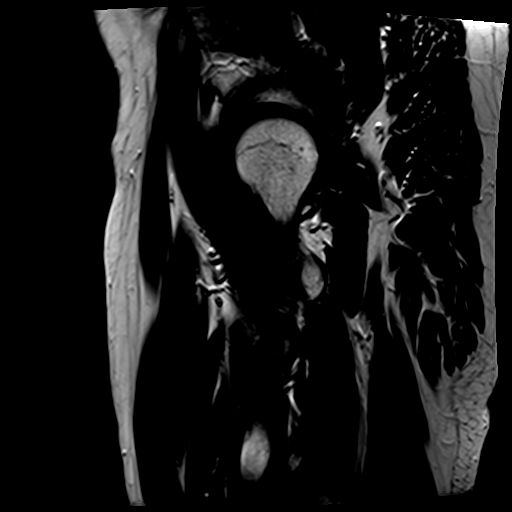
[im 21/35]
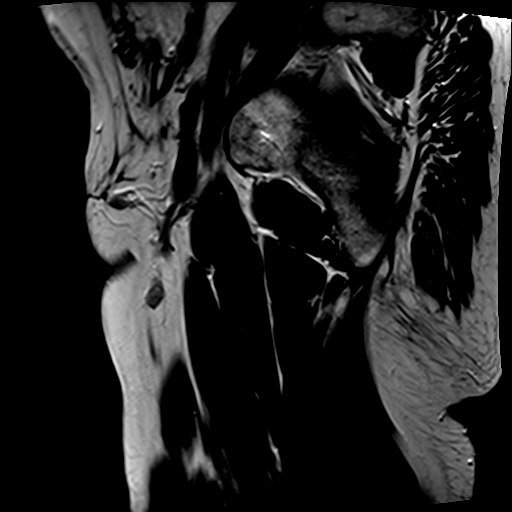
[im 28/35]
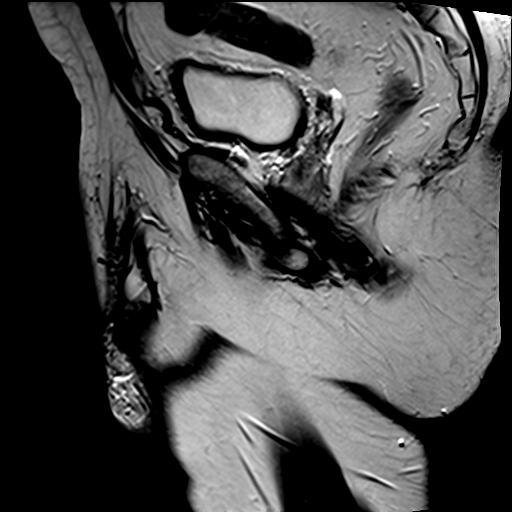
[im 35/35]
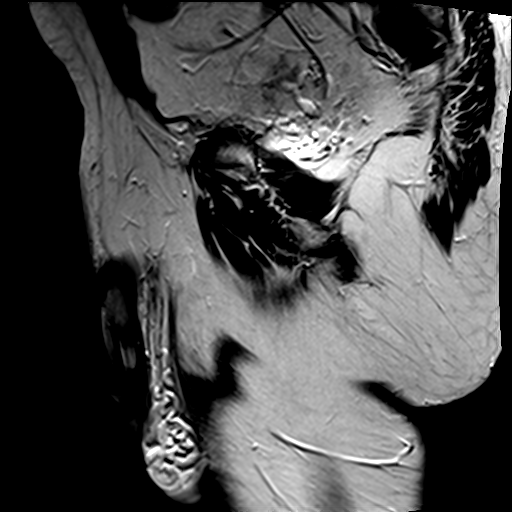

[Series 7: T2 · coronal · 5.0mm · 1.56mm/px · 1 of 28 slices shown]
[im 1/28]
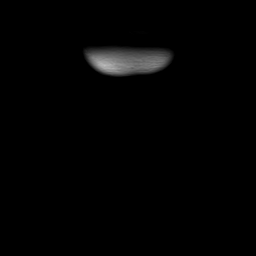

[21 of 48 positions shown; findings below may reference images not displayed]

FINDINGS: Musculoskeletal: Previous right inguinal hernia repair. There is
scarring at the operative site to the expected degree. The fluid
collection in the right inguinal canal has resolved. There are
several small residual hematomas in the inguinal canal including 1
that measures 3.6 x 1.3 x 1.1 cm. I think that the previously
demonstrated fluid collection represented hemorrhage.

There is no residual or new hernia.

Urinary Tract:  No abnormality visualized.

Bowel:  Unremarkable visualized pelvic bowel loops.

Vascular/Lymphatic: No pathologically enlarged lymph nodes. No
significant vascular abnormality seen.

Reproductive: Prostate gland appears normal. Testicles and
epididymides appear normal. No hydroceles.
IMPRESSION: Small residual hematomas in the right inguinal canal, markedly
reduced since the prior CT scan of 05/20/2017.

No recurrent inguinal hernia.

## 2019-11-08 IMAGING — US US SCROTUM W/ DOPPLER COMPLETE
1 series · 13 of 25 positions shown · non-contrast
Comparison: 08/15/2017 pelvic MRI.  04/26/2017 scrotum ultrasound.

CLINICAL DATA: 51 y/o M; right testicle removed 3 months ago per
patient. Six days of pain and swelling.

EXAM:
SCROTAL ULTRASOUND
DOPPLER ULTRASOUND OF THE TESTICLES
TECHNIQUE: Complete ultrasound examination of the testicles, epididymis, and
other scrotal structures was performed. Color and spectral Doppler
ultrasound were also utilized to evaluate blood flow to the
testicles.

[Series 1: us scrotum w/ doppler complete · 0.09mm/px · 62 acquisitions, 13 frames shown]
[im 1/62]
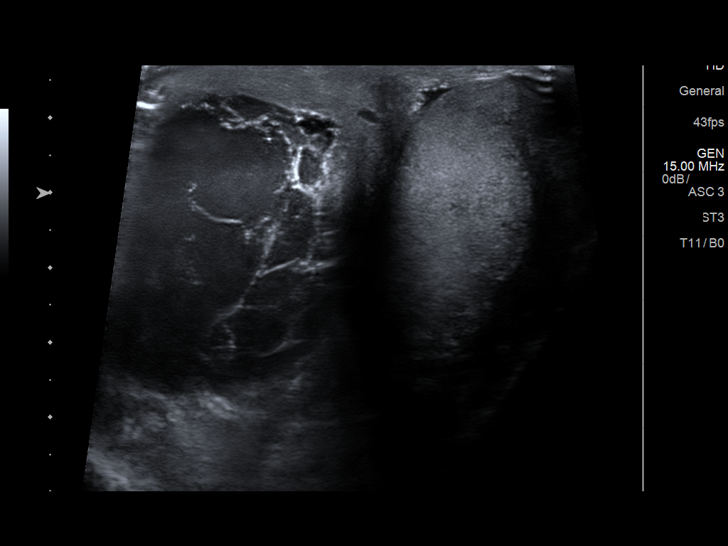
[im 6/62]
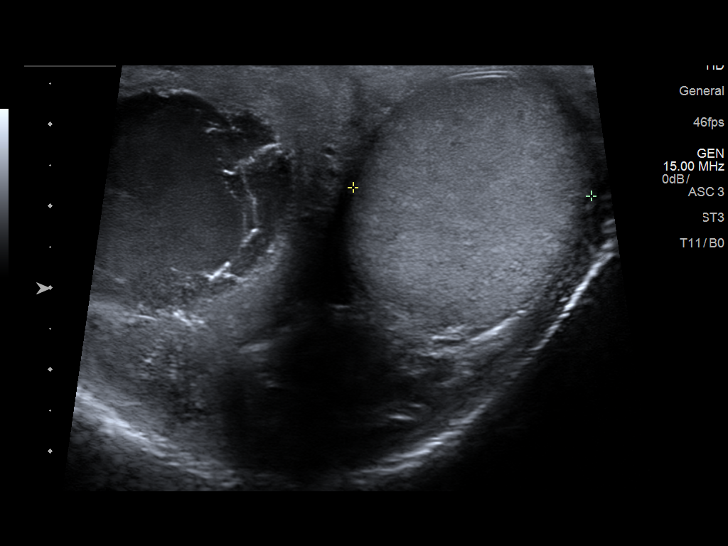
[im 11/62]
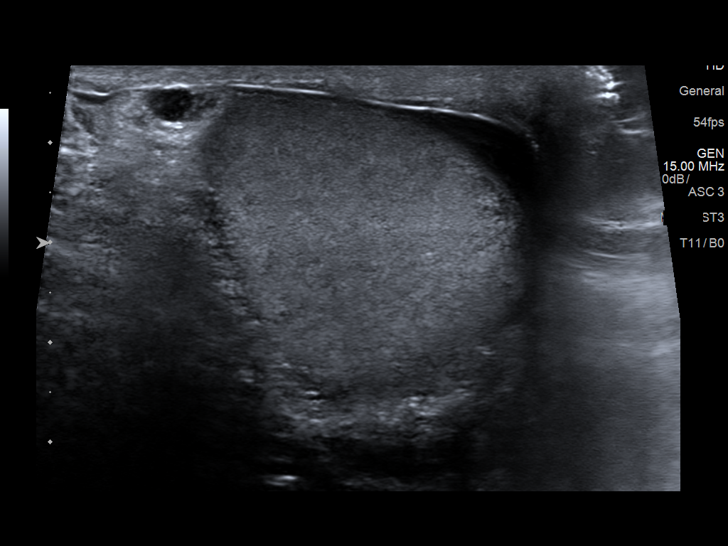
[im 16/62]
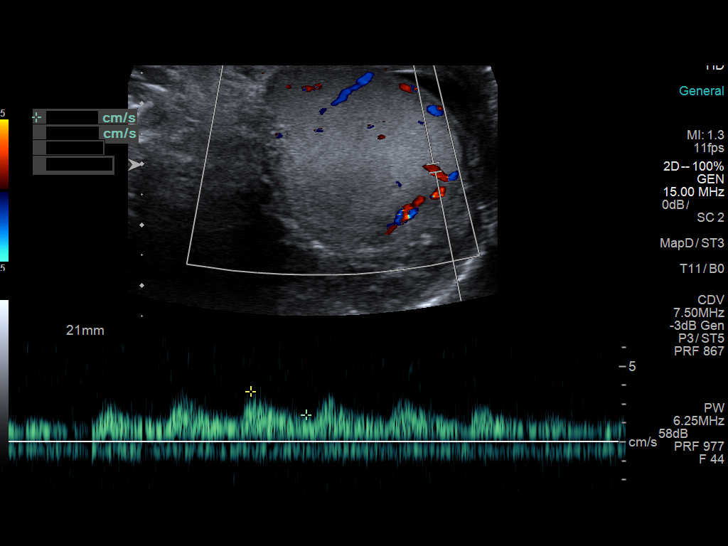
[im 21/62]
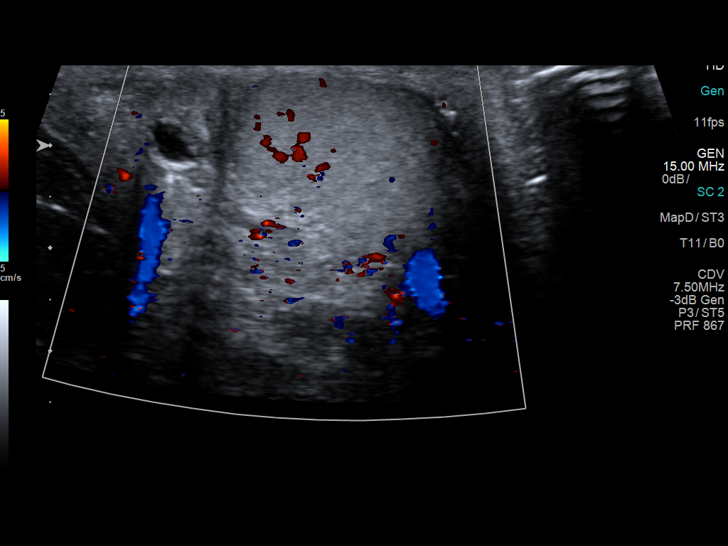
[im 26/62]
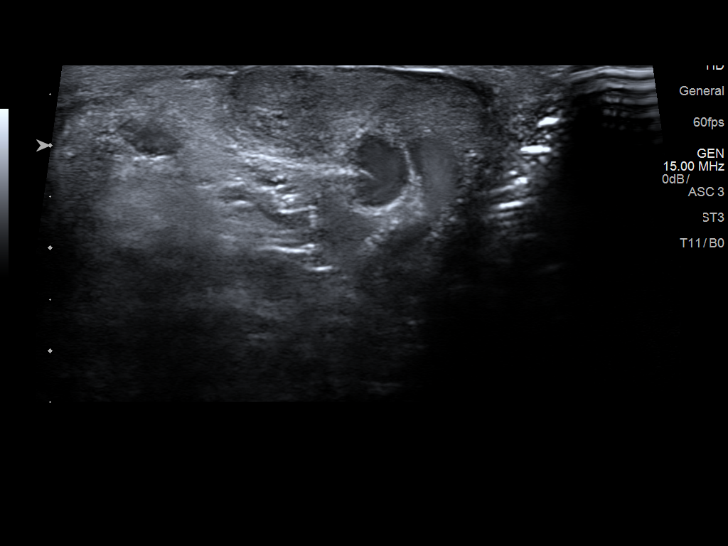
[im 31/62]
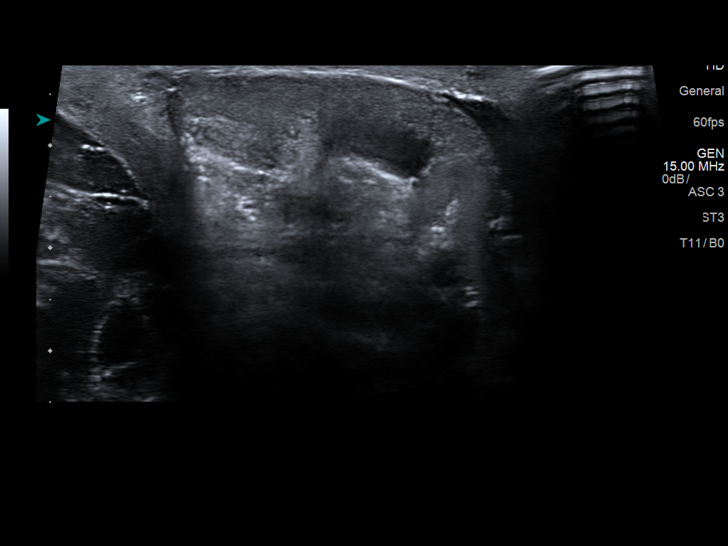
[im 36/62]
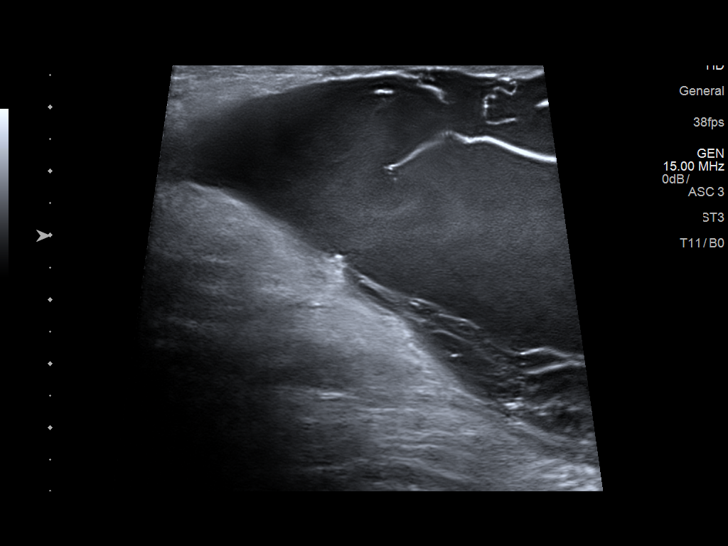
[im 41/62]
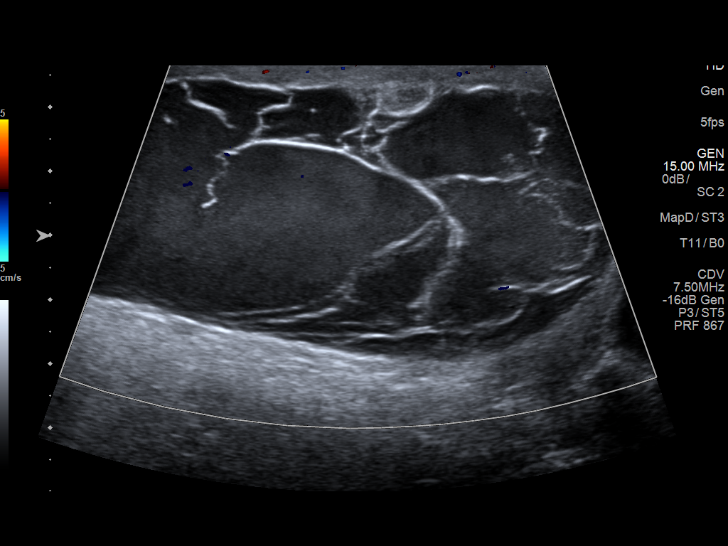
[im 46/62]
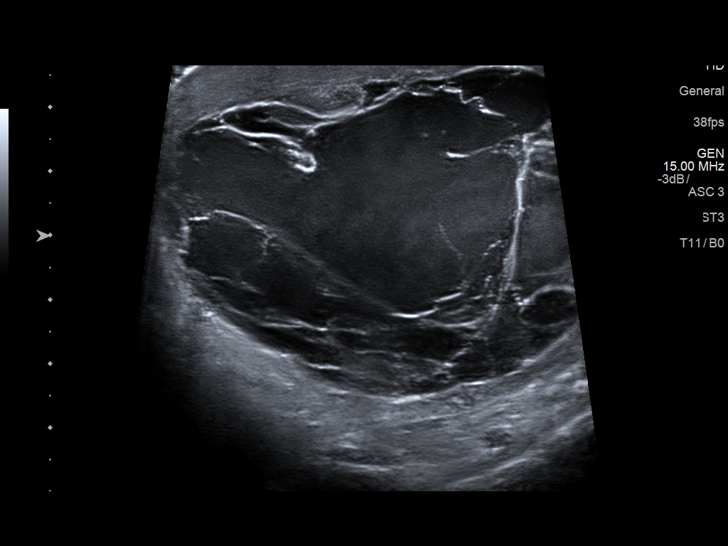
[im 51/62]
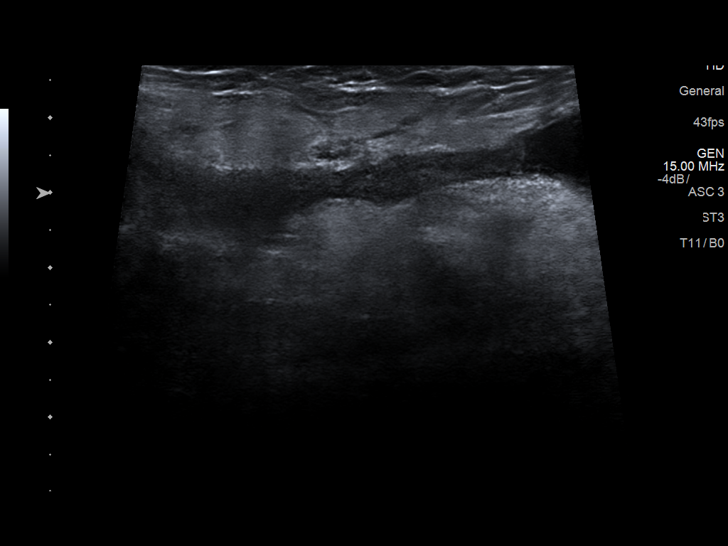
[im 56/62]
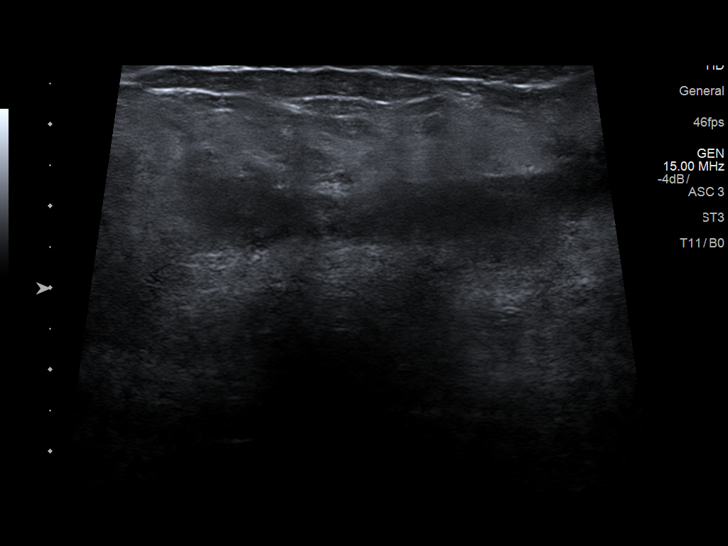
[im 62/62]
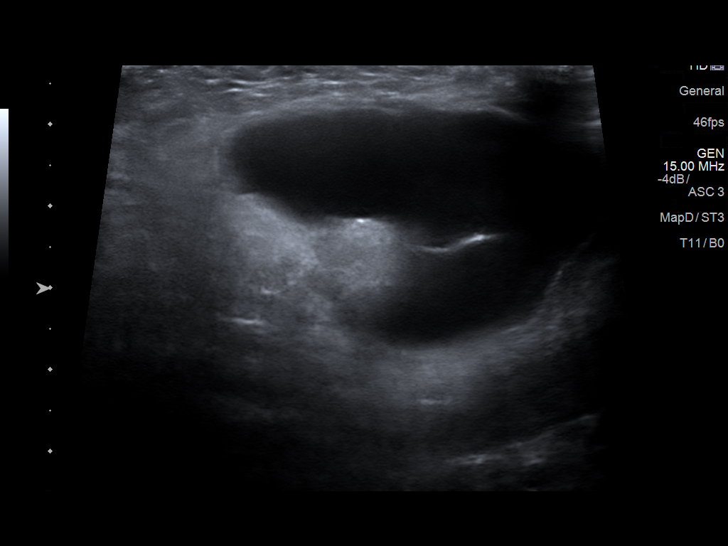

[13 of 25 positions shown; findings below may reference images not displayed]

FINDINGS: Right testicle

Absent.

Left testicle

Measurements: 3.9 x 2.9 x 2.9 cm. No mass or microlithiasis
visualized.

Right epididymis:  Absent.

Left epididymis:  Normal in size and appearance.  6 mm simple cyst.

Hydrocele:  Small left-sided hydrocele.

Varicocele:  Small left-sided varicocele.

Pulsed Doppler interrogation of both testes demonstrates normal low
resistance arterial and venous waveforms on the left.

Other: Complex avascular cystic lesion within the right inguinal
canal extending into the right scrotum, predominantly hypoechoic,
with multiple internal septations. The structure measures 11.5 x
x 7.6 cm in the scrotum and the right inguinal component measures
6.9 x 1.3 cm.
IMPRESSION: Avascular complex cystic lesion within the right inguinal canal and
scrotum, likely a chronic hematoma. No hyperemia to suggest
infection.

No findings of left epididymo-orchitis. Small left-sided hydrocele
and varicocele.

By: Marck Desta M.D.
